# Patient Record
Sex: Female | Born: 1990 | Race: Black or African American | Hispanic: No | Marital: Single | Smoking: Never smoker
Health system: Southern US, Community
[De-identification: ages and names within clinical notes are randomized; demographics above are authoritative.]

## PROBLEM LIST (undated history)

## (undated) DIAGNOSIS — L309 Dermatitis, unspecified: Secondary | ICD-10-CM

## (undated) HISTORY — DX: Dermatitis, unspecified: L30.9

## (undated) HISTORY — PX: NO PAST SURGERIES: SHX2092

---
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
## 2014-08-02 ENCOUNTER — Ambulatory Visit (INDEPENDENT_AMBULATORY_CARE_PROVIDER_SITE_OTHER): Payer: 59 | Admitting: Obstetrics & Gynecology

## 2014-08-02 ENCOUNTER — Encounter: Payer: Self-pay | Admitting: Obstetrics & Gynecology

## 2014-08-02 VITALS — BP 132/87 | HR 71 | Wt 156.0 lb

## 2014-08-02 DIAGNOSIS — Z113 Encounter for screening for infections with a predominantly sexual mode of transmission: Secondary | ICD-10-CM

## 2014-08-02 DIAGNOSIS — A609 Anogenital herpesviral infection, unspecified: Secondary | ICD-10-CM

## 2014-08-02 DIAGNOSIS — Z7251 High risk heterosexual behavior: Secondary | ICD-10-CM

## 2014-08-02 DIAGNOSIS — A6009 Herpesviral infection of other urogenital tract: Secondary | ICD-10-CM

## 2014-08-02 MED ORDER — FAMCICLOVIR 500 MG PO TABS: 500.0000 mg | ORAL_TABLET | Freq: Three times a day (TID) | ORAL | Status: DC

## 2014-08-02 MED ORDER — FAMCICLOVIR 500 MG PO TABS: 500.0000 mg | ORAL_TABLET | Freq: Every day | ORAL | Status: DC

## 2014-08-02 MED ORDER — FAMCICLOVIR 250 MG PO TABS: 250.0000 mg | ORAL_TABLET | Freq: Three times a day (TID) | ORAL | Status: DC

## 2014-08-02 NOTE — Progress Notes (Signed)
Subjective:     Patient ID: Jessica Melendez, female   DOB: [birth year], 23 y.o.   MRN: [id number]  HPI Pt reports that she has painful lesions of the vulva that began 4-5 days prev. She reports that it started with a cold and then a cold sore and then the genital lesions.  She reports that she has had oral intercourse but, not vaginal intercourse for 'months.'  She denies vaginal discharge.   Review of Systems     Objective:   Physical Exam BP 132/87 mmHg  Pulse 71  Wt 156 lb (70.761 kg)  LMP 07/12/2014  Pt in NAD GU: EGBUS: 3 very tender vesicular lesions c/w Herpes at the introitus Could not perform internal exam due to painful lesions.        Assessment:     New onset vesicular lesions c/s genital herpes     Plan:     Cervical cx on urine famvir 250mg  tid x 5day Famvir 500mg  daily for suppresion

## 2014-08-02 NOTE — Patient Instructions (Signed)
Genital Herpes °Genital herpes is a sexually transmitted disease. This means that it is a disease passed by having sex with an infected person. There is no cure for genital herpes. The time between attacks can be months to years. The virus may live in a person but produce no problems (symptoms). This infection can be passed to a baby as it travels down the birth canal (vagina). In a newborn, this can cause central nervous system damage, eye damage, or even death. The virus that causes genital herpes is usually HSV-2 virus. The virus that causes oral herpes is usually HSV-1. The diagnosis (learning what is wrong) is made through culture results. °SYMPTOMS  °Usually symptoms of pain and itching begin a few days to a week after contact. It first appears as small blisters that progress to small painful ulcers which then scab over and heal after several days. It affects the outer genitalia, birth canal, cervix, penis, anal area, buttocks, and thighs. °HOME CARE INSTRUCTIONS  °· Keep ulcerated areas dry and clean. °· Take medications as directed. Antiviral medications can speed up healing. They will not prevent recurrences or cure this infection. These medications can also be taken for suppression if there are frequent recurrences. °· While the infection is active, it is contagious. Avoid all sexual contact during active infections. °· Condoms may help prevent spread of the herpes virus. °· Practice safe sex. °· Wash your hands thoroughly after touching the genital area. °· Avoid touching your eyes after touching your genital area. °· Inform your caregiver if you have had genital herpes and become pregnant. It is your responsibility to insure a safe outcome for your baby in this pregnancy. °· Only take over-the-counter or prescription medicines for pain, discomfort, or fever as directed by your caregiver. °SEEK MEDICAL CARE IF:  °· You have a recurrence of this infection. °· You do not respond to medications and are not  improving. °· You have new sources of pain or discharge which have changed from the original infection. °· You have an oral temperature above 102° F (38.9° C). °· You develop abdominal pain. °· You develop eye pain or signs of eye infection. °Document Released: 08/08/2000 Document Revised: 11/03/2011 Document Reviewed: 08/29/2009 °ExitCare® Patient Information ©2015 [employer]. This information is not intended to replace advice given to you by your health care provider. Make sure you discuss any questions you have with your health care provider. ° °

## 2014-08-03 ENCOUNTER — Other Ambulatory Visit: Payer: Self-pay | Admitting: Obstetrics & Gynecology

## 2014-08-03 DIAGNOSIS — A749 Chlamydial infection, unspecified: Secondary | ICD-10-CM

## 2014-08-03 LAB — GC/CHLAMYDIA PROBE AMP, URINE
Chlamydia, Swab/Urine, PCR: POSITIVE — AB
GC Probe Amp, Urine: NEGATIVE

## 2014-08-03 MED ORDER — AZITHROMYCIN 1 G PO PACK: 1.0000 g | PACK | Freq: Once | ORAL | Status: DC

## 2014-08-04 LAB — HERPES SIMPLEX VIRUS CULTURE: ORGANISM ID, BACTERIA: DETECTED

## 2014-08-04 NOTE — Progress Notes (Signed)
Patient is notified of positive results.

## 2014-08-10 ENCOUNTER — Ambulatory Visit: Payer: 59 | Admitting: Obstetrics & Gynecology

## 2014-08-22 ENCOUNTER — Encounter: Payer: Self-pay | Admitting: Nurse Practitioner

## 2014-08-22 ENCOUNTER — Ambulatory Visit (INDEPENDENT_AMBULATORY_CARE_PROVIDER_SITE_OTHER): Payer: 59 | Admitting: Nurse Practitioner

## 2014-08-22 VITALS — BP 136/76 | HR 67 | Ht 67.0 in | Wt 153.0 lb

## 2014-08-22 DIAGNOSIS — Z30011 Encounter for initial prescription of contraceptive pills: Secondary | ICD-10-CM

## 2014-08-22 DIAGNOSIS — Z309 Encounter for contraceptive management, unspecified: Secondary | ICD-10-CM | POA: Insufficient documentation

## 2014-08-22 DIAGNOSIS — Z124 Encounter for screening for malignant neoplasm of cervix: Secondary | ICD-10-CM

## 2014-08-22 DIAGNOSIS — Z01419 Encounter for gynecological examination (general) (routine) without abnormal findings: Secondary | ICD-10-CM | POA: Insufficient documentation

## 2014-08-22 DIAGNOSIS — Z113 Encounter for screening for infections with a predominantly sexual mode of transmission: Secondary | ICD-10-CM

## 2014-08-22 MED ORDER — LEVONORGEST-ETH ESTRAD 91-DAY 0.15-0.03 MG PO TABS: 1.0000 | ORAL_TABLET | Freq: Every day | ORAL | Status: DC

## 2014-08-22 NOTE — Progress Notes (Signed)
History:  Jessica Melendez a 23 y.o. G0P0000 who presents to [facility] clinic today for well woman exam and contraception management. She has been using condoms and would like to start BCPs. She has taken them in the past without any issues. She denies any migraine with aura, hypertension or blood clotting disorders. 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 Her partner was also treated for Chlamydia. Her LMP was 08/07/14. She denies any other health issues.   The following portions of the patient's history were reviewed and updated as appropriate: allergies, current medications, past family history, past medical history, past social history, past surgical history and problem list.  Review of Systems:  Negative ROS  Objective:  Physical Exam BP 136/76 mmHg  Pulse 67  Ht 5\' 7"  (1.702 m)  Wt 153 lb (69.4 kg)  BMI 23.96 kg/m2  LMP 08/07/2014 GENERAL: Well-developed, well-nourished female in no acute distress.  HEENT: Normocephalic, atraumatic.  NECK: Supple. Normal thyroid.  LUNGS: Normal rate. Clear to auscultation bilaterally.  HEART: Regular rate and rhythm with no adventitious sounds.  BREASTS: Symmetric in size. No masses, skin changes, nipple drainage, or lymphadenopathy. ABDOMEN: Soft, nontender, nondistended. No organomegaly. Normal bowel sounds appreciated in all quadrants.  PELVIC: Normal external female genitalia. Vagina is pink and rugated.  Normal discharge. Normal cervix contour. Pap smear obtained. Uterus is normal in size. No adnexal mass or tenderness.  EXTREMITIES: No cyanosis, clubbing, or edema, 2+ distal pulses.   Labs and Imaging No results found.  Assessment & Plan:  Assessment:  Well Woman Exam Contraception Management  Plans:  Seasonale BCP X 1 year Condoms for one pack and prn partner change Reviewed safe sex practices RTC 1 year or prn    Olegario Messier, NP 08/22/2014 3:21 PM

## 2014-08-22 NOTE — Patient Instructions (Signed)
Contraception Choices Contraception (birth control) is the use of any methods or devices to prevent pregnancy. Below are some methods to help avoid pregnancy. HORMONAL METHODS   Contraceptive implant. This is a thin, plastic tube containing progesterone hormone. It does not contain estrogen hormone. Your health care provider inserts the tube in the inner part of the upper arm. The tube can remain in place for up to 3 years. After 3 years, the implant must be removed. The implant prevents the ovaries from releasing an egg (ovulation), thickens the cervical mucus to prevent sperm from entering the uterus, and thins the lining of the inside of the uterus.  Progesterone-only injections. These injections are given every 3 months by your health care provider to prevent pregnancy. This synthetic progesterone hormone stops the ovaries from releasing eggs. It also thickens cervical mucus and changes the uterine lining. This makes it harder for sperm to survive in the uterus.  Birth control pills. These pills contain estrogen and progesterone hormone. They work by preventing the ovaries from releasing eggs (ovulation). They also cause the cervical mucus to thicken, preventing the sperm from entering the uterus. Birth control pills are prescribed by a health care provider.Birth control pills can also be used to treat heavy periods.  Minipill. This type of birth control pill contains only the progesterone hormone. They are taken every day of each month and must be prescribed by your health care provider.  Birth control patch. The patch contains hormones similar to those in birth control pills. It must be changed once a week and is prescribed by a health care provider.  Vaginal ring. The ring contains hormones similar to those in birth control pills. It is left in the vagina for 3 weeks, removed for 1 week, and then a new one is put back in place. The patient must be comfortable inserting and removing the ring  from the vagina.A health care provider's prescription is necessary.  Emergency contraception. Emergency contraceptives prevent pregnancy after unprotected sexual intercourse. This pill can be taken right after sex or up to 5 days after unprotected sex. It is most effective the sooner you take the pills after having sexual intercourse. Most emergency contraceptive pills are available without a prescription. Check with your pharmacist. Do not use emergency contraception as your only form of birth control. BARRIER METHODS   Female condom. This is a thin sheath (latex or rubber) that is worn over the penis during sexual intercourse. It can be used with spermicide to increase effectiveness.  Female condom. This is a soft, loose-fitting sheath that is put into the vagina before sexual intercourse.  Diaphragm. This is a soft, latex, dome-shaped barrier that must be fitted by a health care provider. It is inserted into the vagina, along with a spermicidal jelly. It is inserted before intercourse. The diaphragm should be left in the vagina for 6 to 8 hours after intercourse.  Cervical cap. This is a round, soft, latex or plastic cup that fits over the cervix and must be fitted by a health care provider. The cap can be left in place for up to 48 hours after intercourse.  Sponge. This is a soft, circular piece of polyurethane foam. The sponge has spermicide in it. It is inserted into the vagina after wetting it and before sexual intercourse.  Spermicides. These are chemicals that kill or block sperm from entering the cervix and uterus. They come in the form of creams, jellies, suppositories, foam, or tablets. They do not require a   prescription. They are inserted into the vagina with an applicator before having sexual intercourse. The process must be repeated every time you have sexual intercourse. INTRAUTERINE CONTRACEPTION  Intrauterine device (IUD). This is a T-shaped device that is put in a woman's uterus  during a menstrual period to prevent pregnancy. There are 2 types:  Copper IUD. This type of IUD is wrapped in copper wire and is placed inside the uterus. Copper makes the uterus and fallopian tubes produce a fluid that kills sperm. It can stay in place for 10 years.  Hormone IUD. This type of IUD contains the hormone progestin (synthetic progesterone). The hormone thickens the cervical mucus and prevents sperm from entering the uterus, and it also thins the uterine lining to prevent implantation of a fertilized egg. The hormone can weaken or kill the sperm that get into the uterus. It can stay in place for 3-5 years, depending on which type of IUD is used. PERMANENT METHODS OF CONTRACEPTION  Female tubal ligation. This is when the woman's fallopian tubes are surgically sealed, tied, or blocked to prevent the egg from traveling to the uterus.  Hysteroscopic sterilization. This involves placing a small coil or insert into each fallopian tube. Your doctor uses a technique called hysteroscopy to do the procedure. The device causes scar tissue to form. This results in permanent blockage of the fallopian tubes, so the sperm cannot fertilize the egg. It takes about 3 months after the procedure for the tubes to become blocked. You must use another form of birth control for these 3 months.  Female sterilization. This is when the female has the tubes that carry sperm tied off (vasectomy).This blocks sperm from entering the vagina during sexual intercourse. After the procedure, the man can still ejaculate fluid (semen). NATURAL PLANNING METHODS  Natural family planning. This is not having sexual intercourse or using a barrier method (condom, diaphragm, cervical cap) on days the woman could become pregnant.  Calendar method. This is keeping track of the length of each menstrual cycle and identifying when you are fertile.  Ovulation method. This is avoiding sexual intercourse during ovulation.  Symptothermal  method. This is avoiding sexual intercourse during ovulation, using a thermometer and ovulation symptoms.  Post-ovulation method. This is timing sexual intercourse after you have ovulated. Regardless of which type or method of contraception you choose, it is important that you use condoms to protect against the transmission of sexually transmitted infections (STIs). Talk with your health care provider about which form of contraception is most appropriate for you. Document Released: 08/11/2005 Document Revised: 08/16/2013 Document Reviewed: 02/03/2013 ExitCare Patient Information 2015 [employer]. This information is not intended to replace advice given to you by your health care provider. Make sure you discuss any questions you have with your health care provider.  

## 2014-08-22 NOTE — Progress Notes (Signed)
Here today for gyn physical and to discuss [facility], currently using condoms.  No unprotected sex within last two weeks.

## 2014-08-28 LAB — CYTOLOGY - PAP

## 2014-10-23 ENCOUNTER — Telehealth (HOSPITAL_COMMUNITY): Payer: Self-pay

## 2014-10-23 NOTE — Telephone Encounter (Signed)
STD treatment requested by [facility] w/the [facility].Marland Kitchen

## 2015-03-20 ENCOUNTER — Encounter: Payer: Self-pay | Admitting: Obstetrics & Gynecology

## 2015-03-20 ENCOUNTER — Ambulatory Visit (INDEPENDENT_AMBULATORY_CARE_PROVIDER_SITE_OTHER): Payer: 59 | Admitting: Obstetrics & Gynecology

## 2015-03-20 ENCOUNTER — Other Ambulatory Visit (HOSPITAL_COMMUNITY)
Admission: RE | Admit: 2015-03-20 | Discharge: 2015-03-20 | Disposition: A | Discharge status: Home / Self Care | Payer: 59 | Source: Ambulatory Visit | Attending: Obstetrics & Gynecology | Admitting: Obstetrics & Gynecology

## 2015-03-20 VITALS — BP 116/78 | HR 82 | Resp 16 | Wt 156.0 lb

## 2015-03-20 DIAGNOSIS — Z113 Encounter for screening for infections with a predominantly sexual mode of transmission: Secondary | ICD-10-CM | POA: Insufficient documentation

## 2015-03-20 DIAGNOSIS — Z01419 Encounter for gynecological examination (general) (routine) without abnormal findings: Secondary | ICD-10-CM

## 2015-03-20 DIAGNOSIS — Z30011 Encounter for initial prescription of contraceptive pills: Secondary | ICD-10-CM | POA: Diagnosis not present

## 2015-03-20 DIAGNOSIS — Z0000 Encounter for general adult medical examination without abnormal findings: Secondary | ICD-10-CM

## 2015-03-20 MED ORDER — LEVONORGEST-ETH ESTRAD 91-DAY 0.15-0.03 &0.01 MG PO TABS: 1.0000 | ORAL_TABLET | Freq: Every day | ORAL | Status: DC

## 2015-03-20 NOTE — Progress Notes (Signed)
Subjective:    Jessica Melendez is a 24 y.o. S AA G96  female who presents for an annual exam. The patient has no complaints today. She would like to start birth control. She previously used OCPs, using condoms since then. The patient is sexually active. GYN screening history: last pap: was normal. The patient wears seatbelts: yes. The patient participates in regular exercise: yes. (played soccer in college)  Has the patient ever been transfused or tattooed?: no. The patient reports that there is not domestic violence in her life.   Menstrual History: OB History    Gravida Para Term Preterm AB TAB SAB Ectopic Multiple Living   0 0 0 0 0 0 0 0 0 0       Menarche age: 5  Patient's last menstrual period was 03/14/2015.    The following portions of the patient's history were reviewed and updated as appropriate: allergies, current medications, past family history, past medical history, past social history, past surgical history and problem list.  Review of Systems A comprehensive review of systems was negative. monogamous for about a year. Graduated and plans to go to nursing school (Mom is a Marine scientist at [employer]).   Objective:    BP 116/78 mmHg  Pulse 82  Wt 156 lb (70.761 kg)  LMP 03/14/2015  General Appearance:    Alert, cooperative, no distress, appears stated age  Head:    Normocephalic, without obvious abnormality, atraumatic  Eyes:    PERRL, conjunctiva/corneas clear, EOM's intact, fundi    benign, both eyes  Ears:    Normal TM's and external ear canals, both ears  Nose:   Nares normal, septum midline, mucosa normal, no drainage    or sinus tenderness  Throat:   Lips, mucosa, and tongue normal; teeth and gums normal  Neck:   Supple, symmetrical, trachea midline, no adenopathy;    thyroid:  no enlargement/tenderness/nodules; no carotid   bruit or JVD  Back:     Symmetric, no curvature, ROM normal, no CVA tenderness  Lungs:     Clear to auscultation bilaterally, respirations unlabored   Chest Wall:    No tenderness or deformity   Heart:    Regular rate and rhythm, S1 and S2 normal, no murmur, rub   or gallop  Breast Exam:    No tenderness, masses, or nipple abnormality  Abdomen:     Soft, non-tender, bowel sounds active all four quadrants,    no masses, no organomegaly  Genitalia:    Normal female without lesion, discharge or tenderness, NSSR, NT, minimal mobility, normal adnexal exam     Extremities:   Extremities normal, atraumatic, no cyanosis or edema  Pulses:   2+ and symmetric all extremities  Skin:   Skin color, texture, turgor normal, no rashes or lesions  Lymph nodes:   Cervical, supraclavicular, and axillary nodes normal  Neurologic:   CNII-XII intact, normal strength, sensation and reflexes    throughout  .    Assessment:    Healthy female exam.   Contraception   Plan:     Breast self exam technique reviewed and patient encouraged to perform self-exam monthly. Chlamydia specimen. GC specimen. Thin prep Pap smear.   camrese extended cycle OCPs She will find out if she has had Gardasil and rtc for it prn

## 2015-03-21 LAB — CYTOLOGY - PAP

## 2015-03-29 ENCOUNTER — Encounter: Payer: Self-pay | Admitting: Primary Care

## 2015-03-29 ENCOUNTER — Ambulatory Visit (INDEPENDENT_AMBULATORY_CARE_PROVIDER_SITE_OTHER): Payer: 59 | Admitting: Primary Care

## 2015-03-29 VITALS — BP 106/68 | HR 70 | Temp 98.2°F | Ht 67.0 in | Wt 158.4 lb

## 2015-03-29 DIAGNOSIS — Z7189 Other specified counseling: Secondary | ICD-10-CM

## 2015-03-29 DIAGNOSIS — Z7689 Persons encountering health services in other specified circumstances: Secondary | ICD-10-CM

## 2015-03-29 NOTE — Progress Notes (Signed)
Pre visit review using our clinic review tool, if applicable. No additional management support is needed unless otherwise documented below in the visit note. 

## 2015-03-29 NOTE — Progress Notes (Signed)
   Subjective:    Patient ID: Jessica Melendez, female    DOB: 27-Jul-1991, 24 y.o.   MRN: [id number]  HPI  Jessica Melendez is a 24 year old female who presents today to establish care. She has no complaints today. She follows with OB/GYN for annual exams and birth control maintenance. Her pap was completed this year. She's not had a complete physical in several years.   Review of Systems  Constitutional: Negative for unexpected weight change.  HENT: Negative for rhinorrhea.   Respiratory: Negative for cough and shortness of breath.   Cardiovascular: Negative for chest pain.  Gastrointestinal: Negative for diarrhea and constipation.  Genitourinary: Negative for difficulty urinating.       Periods regular. Follows with GYN  Musculoskeletal: Negative for myalgias and arthralgias.  Skin: Negative for rash.  Neurological: Negative for dizziness, numbness and headaches.  Psychiatric/Behavioral:       Denies concerns for anxiety or depression       No past medical history on file.  History   Social History  . Marital Status: Single    Spouse Name: N/A  . Number of Children: N/A  . Years of Education: N/A   Occupational History  . Not on file.   Social History Main Topics  . Smoking status: Never Smoker   . Smokeless tobacco: Never Used  . Alcohol Use: 0.0 oz/week    0 Standard drinks or equivalent per week     Comment: socially  . Drug Use: No  . Sexual Activity:    Partners: Male    Birth Control/ Protection: Condom   Other Topics Concern  . Not on file   Social History Narrative   Single.   Highest level of education in Biology from [place].   She aspires to be a Marine scientist and then possibly to be a NP.   Works as a Programme researcher, broadcasting/film/video at [employer].   Enjoys playing soccer.     No past surgical history on file.  Family History  Problem Relation Age of Onset  . Diabetes Paternal Grandfather     No Known Allergies  No current outpatient prescriptions on file prior to visit.     No current facility-administered medications on file prior to visit.    BP 106/68 mmHg  Pulse 70  Temp(Src) 98.2 F (36.8 C) (Oral)  Ht 5\' 7"  (1.702 m)  Wt 158 lb 6.4 oz (71.85 kg)  BMI 24.80 kg/m2  SpO2 99%  LMP 03/14/2015    Objective:   Physical Exam  Constitutional: She is oriented to person, place, and time. She appears well-nourished.  Cardiovascular: Normal rate and regular rhythm.   Pulmonary/Chest: Effort normal and breath sounds normal.  Neurological: She is alert and oriented to person, place, and time.  Skin: Skin is warm and dry.  Psychiatric: She has a normal mood and affect.          Assessment & Plan:  No complaints today.  Due for physical, will have scheduled in the next 4 months. Managed by GYN for birth control pills and annual exams.

## 2015-03-29 NOTE — Patient Instructions (Addendum)
Please schedule a physical with me in the next 4 months.   It was a pleasure to meet you today! Please don't hesitate to call me with any questions. Welcome to [facility]!

## 2015-06-04 ENCOUNTER — Telehealth: Payer: Self-pay | Admitting: Primary Care

## 2015-06-04 NOTE — Telephone Encounter (Signed)
Place form in Kate's inbox. 

## 2015-06-04 NOTE — Telephone Encounter (Signed)
Completed and is ready for pick up at her convenience.

## 2015-06-04 NOTE — Telephone Encounter (Signed)
Pt dropped off form for work. Please call [phone] when ready to pick up. Placing  On Chan;s desk  thanks

## 2015-06-06 ENCOUNTER — Encounter: Payer: Self-pay | Admitting: Primary Care

## 2015-06-27 ENCOUNTER — Encounter: Payer: Self-pay | Admitting: Primary Care

## 2015-06-27 ENCOUNTER — Ambulatory Visit (INDEPENDENT_AMBULATORY_CARE_PROVIDER_SITE_OTHER): Payer: 59 | Admitting: Primary Care

## 2015-06-27 VITALS — BP 118/76 | HR 74 | Temp 98.6°F | Ht 67.0 in | Wt 158.8 lb

## 2015-06-27 DIAGNOSIS — R05 Cough: Secondary | ICD-10-CM | POA: Diagnosis not present

## 2015-06-27 DIAGNOSIS — R059 Cough, unspecified: Secondary | ICD-10-CM

## 2015-06-27 MED ORDER — LEVONORGEST-ETH ESTRAD 91-DAY 0.15-0.03 &0.01 MG PO TABS: 1.0000 | ORAL_TABLET | Freq: Every day | ORAL | Status: DC

## 2015-06-27 MED ORDER — BENZONATATE 200 MG PO CAPS: 200.0000 mg | ORAL_CAPSULE | Freq: Three times a day (TID) | ORAL | Status: DC | PRN

## 2015-06-27 MED ORDER — HYDROCODONE-HOMATROPINE 5-1.5 MG/5ML PO SYRP: 5.0000 mL | ORAL_SOLUTION | Freq: Every evening | ORAL | Status: DC | PRN

## 2015-06-27 NOTE — Addendum Note (Signed)
Addended by: Jacqualin Combes on: 06/27/2015 03:39 PM   Modules accepted: Orders

## 2015-06-27 NOTE — Progress Notes (Signed)
Subjective:    Patient ID: Jessica Melendez, female    DOB: December 07, 1990, 24 y.o.   MRN: [id number]  HPI  Jessica Melendez is a 24 year old female who presents today with a chief complaint of cough. She also reports symptoms of sore throat, nasal congestion, and fever of 101 on Saturday. She's not noticed a fever since. Her symptoms began Saturday night. She was starting to feel better on Monday morning and then started to feel worse on Tuesday. She's tried taking Nyquil and Dayquil without improvement in cough, and ibuprofen with some improvement in sore throat.  Her cough has become worse and her sore throat is very bothersome.   Review of Systems  Constitutional: Positive for fever and chills.  HENT: Positive for congestion, sinus pressure and sore throat. Negative for ear pain.   Respiratory: Positive for cough. Negative for shortness of breath.   Cardiovascular: Negative for chest pain.  Gastrointestinal: Negative for nausea.  Musculoskeletal: Negative for myalgias.       No past medical history on file.  Social History   Social History  . Marital Status: Single    Spouse Name: N/A  . Number of Children: N/A  . Years of Education: N/A   Occupational History  . Not on file.   Social History Main Topics  . Smoking status: Never Smoker   . Smokeless tobacco: Never Used  . Alcohol Use: 0.0 oz/week    0 Standard drinks or equivalent per week     Comment: socially  . Drug Use: No  . Sexual Activity:    Partners: Male    Birth Control/ Protection: Condom   Other Topics Concern  . Not on file   Social History Narrative   ** Merged History Encounter **       Single. Highest level of education in Biology from [employer]. She aspires to be a Marine scientist and then possibly to be a NP. 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     No past surgical history on file.  Family History  Problem Relation Age of Onset  . Diabetes Paternal Grandfather     No Known  Allergies  Current Outpatient Prescriptions on File Prior to Visit  Medication Sig Dispense Refill  . Levonorgestrel-Ethinyl Estradiol (AMETHIA,CAMRESE) 0.15-0.03 &0.01 MG tablet Take 1 tablet by mouth daily. 1 Package 4   No current facility-administered medications on file prior to visit.    BP 118/76 mmHg  Pulse 74  Temp(Src) 98.6 F (37 C) (Oral)  Ht 5\' 7"  (1.702 m)  Wt 158 lb 12.8 oz (72.031 kg)  BMI 24.87 kg/m2  SpO2 99%  LMP 06/24/2015    Objective:   Physical Exam  Constitutional: She appears well-nourished.  HENT:  Right Ear: Tympanic membrane and ear canal normal.  Left Ear: Tympanic membrane and ear canal normal.  Nose: Right sinus exhibits no maxillary sinus tenderness and no frontal sinus tenderness. Left sinus exhibits no maxillary sinus tenderness and no frontal sinus tenderness.  Mouth/Throat: Oropharynx is clear and moist.  Eyes: Conjunctivae are normal. Pupils are equal, round, and reactive to light.  Neck: Neck supple.  Cardiovascular: Normal rate and regular rhythm.   Pulmonary/Chest: Effort normal and breath sounds normal. She has no wheezes. She has no rales.  Lymphadenopathy:    She has no cervical adenopathy.  Skin: Skin is warm and dry.          Assessment & Plan:  Viral URI:  Cough, nasal  congestion, fevers, sinus pressure x 4 days. Overall feeling worse, especially cough. No improvement with OTC cough meds. Some improvement with ibuprofen. Exam unremarkable which is reassuring.  Discussed treatment with supportive measures. RX for tessalon pearls for daytime cough, Hycodan HS PRN. Flonase, mucinex, ibuprofen. She is to call Monday if no improvement.

## 2015-06-27 NOTE — Progress Notes (Signed)
Pre visit review using our clinic review tool, if applicable. No additional management support is needed unless otherwise documented below in the visit note. 

## 2015-06-27 NOTE — Patient Instructions (Signed)
Your symptoms are related to a viral illness which is not treated with antibiotics.  Sore throat: Take ibuprofen 600 mg three times daily as needed. You should also do warm, salt gargles three times daily.   Nasal congestion: Start Flonase nasal spray. Instill 2 sprays in each nostril once daily.  Cough: You may take the Benzonatate capsules. Take 1 capsule by mouth three times daily as needed for daytime cough. You may take the Hycodan cough syrup at bedtime for cough and rest.  Please call me if no improvement Monday next week.  It was a pleasure to see you today!  Upper Respiratory Infection, Adult Most upper respiratory infections (URIs) are a viral infection of the air passages leading to the lungs. A URI affects the nose, throat, and upper air passages. The most common type of URI is nasopharyngitis and is typically referred to as "the common cold." URIs run their course and usually go away on their own. Most of the time, a URI does not require medical attention, but sometimes a bacterial infection in the upper airways can follow a viral infection. This is called a secondary infection. Sinus and middle ear infections are common types of secondary upper respiratory infections. Bacterial pneumonia can also complicate a URI. A URI can worsen asthma and chronic obstructive pulmonary disease (COPD). Sometimes, these complications can require emergency medical care and may be life threatening.  CAUSES Almost all URIs are caused by viruses. A virus is a type of germ and can spread from one person to another.  RISKS FACTORS You may be at risk for a URI if:   You smoke.   You have chronic heart or lung disease.  You have a weakened defense (immune) system.   You are very young or very old.   You have nasal allergies or asthma.  You work in crowded or poorly ventilated areas.  You work in health care facilities or schools. SIGNS AND SYMPTOMS  Symptoms typically develop 2-3 days  after you come in contact with a cold virus. Most viral URIs last 7-10 days. However, viral URIs from the influenza virus (flu virus) can last 14-18 days and are typically more severe. Symptoms may include:   Runny or stuffy (congested) nose.   Sneezing.   Cough.   Sore throat.   Headache.   Fatigue.   Fever.   Loss of appetite.   Pain in your forehead, behind your eyes, and over your cheekbones (sinus pain).  Muscle aches.  DIAGNOSIS  Your health care Taylon Coole may diagnose a URI by:  Physical exam.  Tests to check that your symptoms are not due to another condition such as:  Strep throat.  Sinusitis.  Pneumonia.  Asthma. TREATMENT  A URI goes away on its own with time. It cannot be cured with medicines, but medicines may be prescribed or recommended to relieve symptoms. Medicines may help:  Reduce your fever.  Reduce your cough.  Relieve nasal congestion. HOME CARE INSTRUCTIONS   Take medicines only as directed by your health care Daanish Copes.   Gargle warm saltwater or take cough drops to comfort your throat as directed by your health care Danasha Melman.  Use a warm mist humidifier or inhale steam from a shower to increase air moisture. This may make it easier to breathe.  Drink enough fluid to keep your urine clear or pale yellow.   Eat soups and other clear broths and maintain good nutrition.   Rest as needed.   Return to work  when your temperature has returned to normal or as your health care Chasin Findling advises. You may need to stay home longer to avoid infecting others. You can also use a face mask and careful hand washing to prevent spread of the virus.  Increase the usage of your inhaler if you have asthma.   Do not use any tobacco products, including cigarettes, chewing tobacco, or electronic cigarettes. If you need help quitting, ask your health care Tranise Forrest. PREVENTION  The best way to protect yourself from getting a cold is to practice  good hygiene.   Avoid oral or hand contact with people with cold symptoms.   Wash your hands often if contact occurs.  There is no clear evidence that vitamin C, vitamin E, echinacea, or exercise reduces the chance of developing a cold. However, it is always recommended to get plenty of rest, exercise, and practice good nutrition.  SEEK MEDICAL CARE IF:   You are getting worse rather than better.   Your symptoms are not controlled by medicine.   You have chills.  You have worsening shortness of breath.  You have brown or red mucus.  You have yellow or brown nasal discharge.  You have pain in your face, especially when you bend forward.  You have a fever.  You have swollen neck glands.  You have pain while swallowing.  You have white areas in the back of your throat. SEEK IMMEDIATE MEDICAL CARE IF:   You have severe or persistent:  Headache.  Ear pain.  Sinus pain.  Chest pain.  You have chronic lung disease and any of the following:  Wheezing.  Prolonged cough.  Coughing up blood.  A change in your usual mucus.  You have a stiff neck.  You have changes in your:  Vision.  Hearing.  Thinking.  Mood. MAKE SURE YOU:   Understand these instructions.  Will watch your condition.  Will get help right away if you are not doing well or get worse.   This information is not intended to replace advice given to you by your health care Zamarion Longest. Make sure you discuss any questions you have with your health care Ellenie Salome.   Document Released: 02/04/2001 Document Revised: 12/26/2014 Document Reviewed: 11/16/2013 Elsevier Interactive Patient Education [employer].

## 2015-07-31 ENCOUNTER — Encounter: Payer: 59 | Admitting: Primary Care

## 2015-07-31 DIAGNOSIS — Z0289 Encounter for other administrative examinations: Secondary | ICD-10-CM

## 2016-01-23 ENCOUNTER — Emergency Department: Payer: No Typology Code available for payment source

## 2016-01-23 ENCOUNTER — Encounter: Payer: Self-pay | Admitting: Medical Oncology

## 2016-01-23 ENCOUNTER — Emergency Department
Admission: EM | Admit: 2016-01-23 | Discharge: 2016-01-23 | Disposition: A | Discharge status: Home / Self Care | Payer: No Typology Code available for payment source | Attending: Emergency Medicine | Admitting: Emergency Medicine

## 2016-01-23 DIAGNOSIS — Y999 Unspecified external cause status: Secondary | ICD-10-CM | POA: Insufficient documentation

## 2016-01-23 DIAGNOSIS — Y92410 Unspecified street and highway as the place of occurrence of the external cause: Secondary | ICD-10-CM | POA: Diagnosis not present

## 2016-01-23 DIAGNOSIS — S4992XA Unspecified injury of left shoulder and upper arm, initial encounter: Secondary | ICD-10-CM | POA: Diagnosis not present

## 2016-01-23 DIAGNOSIS — S60511A Abrasion of right hand, initial encounter: Secondary | ICD-10-CM | POA: Diagnosis not present

## 2016-01-23 DIAGNOSIS — S8002XA Contusion of left knee, initial encounter: Secondary | ICD-10-CM | POA: Diagnosis not present

## 2016-01-23 DIAGNOSIS — S60512A Abrasion of left hand, initial encounter: Secondary | ICD-10-CM | POA: Diagnosis not present

## 2016-01-23 DIAGNOSIS — S46912A Strain of unspecified muscle, fascia and tendon at shoulder and upper arm level, left arm, initial encounter: Secondary | ICD-10-CM | POA: Diagnosis not present

## 2016-01-23 DIAGNOSIS — Z79899 Other long term (current) drug therapy: Secondary | ICD-10-CM | POA: Diagnosis not present

## 2016-01-23 DIAGNOSIS — S8992XA Unspecified injury of left lower leg, initial encounter: Secondary | ICD-10-CM | POA: Diagnosis not present

## 2016-01-23 DIAGNOSIS — Y9389 Activity, other specified: Secondary | ICD-10-CM | POA: Insufficient documentation

## 2016-01-23 DIAGNOSIS — M25512 Pain in left shoulder: Secondary | ICD-10-CM | POA: Diagnosis not present

## 2016-01-23 DIAGNOSIS — S46911A Strain of unspecified muscle, fascia and tendon at shoulder and upper arm level, right arm, initial encounter: Secondary | ICD-10-CM | POA: Diagnosis not present

## 2016-01-23 DIAGNOSIS — M25562 Pain in left knee: Secondary | ICD-10-CM | POA: Diagnosis not present

## 2016-01-23 DIAGNOSIS — T07XXXA Unspecified multiple injuries, initial encounter: Secondary | ICD-10-CM

## 2016-01-23 MED ORDER — IBUPROFEN 600 MG PO TABS: 600.0000 mg | ORAL_TABLET | Freq: Three times a day (TID) | ORAL | Status: DC | PRN

## 2016-01-23 NOTE — Discharge Instructions (Signed)
Begin taking ibuprofen as needed for pain and inflammation. May also use ice to areas as needed for discomfort. Apply a thin layer of Neosporin to abrasions and watch for signs of infection. Follow up with  your doctor if any continued problems
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
## 2016-01-25 ENCOUNTER — Ambulatory Visit: Payer: 59 | Admitting: Family Medicine

## 2016-01-25 DIAGNOSIS — Z0289 Encounter for other administrative examinations: Secondary | ICD-10-CM

## 2016-01-31 ENCOUNTER — Ambulatory Visit (INDEPENDENT_AMBULATORY_CARE_PROVIDER_SITE_OTHER): Payer: 59 | Admitting: Primary Care

## 2016-01-31 VITALS — BP 112/66 | HR 74 | Temp 98.0°F | Ht 67.0 in | Wt 161.8 lb

## 2016-01-31 DIAGNOSIS — M62838 Other muscle spasm: Secondary | ICD-10-CM

## 2016-01-31 DIAGNOSIS — N898 Other specified noninflammatory disorders of vagina: Secondary | ICD-10-CM

## 2016-01-31 MED ORDER — CYCLOBENZAPRINE HCL 5 MG PO TABS: 5.0000 mg | ORAL_TABLET | Freq: Three times a day (TID) | ORAL | Status: DC | PRN

## 2016-01-31 NOTE — Progress Notes (Signed)
Pre visit review using our clinic review tool, if applicable. No additional management support is needed unless otherwise documented below in the visit note. 

## 2016-01-31 NOTE — Patient Instructions (Addendum)
Start Cyclobenzaprine tablets for muscle stiffness/spasms to left shoulder. Take 1 tablet by mouth 1-3 times daily as needed for muscle spasms. Caution as this may cause drowsiness.  You will be sore for the next 1-2 weeks, but you should gradually notice an improvement between now and then.  Please notify me if you've not experienced any improvement in discomfort in 1 week.  I will notify you once I receive your vaginal specimen.  It was a pleasure to see you today!
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
## 2016-02-01 LAB — WET PREP BY MOLECULAR PROBE
Candida species: NEGATIVE
GARDNERELLA VAGINALIS: NEGATIVE
TRICHOMONAS VAG: NEGATIVE

## 2016-02-28 ENCOUNTER — Ambulatory Visit (INDEPENDENT_AMBULATORY_CARE_PROVIDER_SITE_OTHER): Payer: 59 | Admitting: Primary Care

## 2016-02-28 ENCOUNTER — Encounter: Payer: Self-pay | Admitting: Primary Care

## 2016-02-28 VITALS — BP 108/72 | HR 66 | Temp 98.4°F | Ht 67.0 in | Wt 160.8 lb

## 2016-02-28 DIAGNOSIS — M25512 Pain in left shoulder: Secondary | ICD-10-CM

## 2016-02-28 DIAGNOSIS — M62830 Muscle spasm of back: Secondary | ICD-10-CM

## 2016-02-28 MED ORDER — METHOCARBAMOL 500 MG PO TABS: 500.0000 mg | ORAL_TABLET | Freq: Three times a day (TID) | ORAL | Status: DC | PRN

## 2016-02-28 NOTE — Progress Notes (Signed)
Pre visit review using our clinic review tool, if applicable. No additional management support is needed unless otherwise documented below in the visit note. 

## 2016-02-28 NOTE — Patient Instructions (Addendum)
Stop Flexeril medication for muscle spasms. Start Robaxin (methocarbamol) tablets as needed for muscle spasms. Take 1 tablet by mouth three times daily as needed. This may cause drowsiness.   You will be contacted regarding your referral to Physical Therapy.  Please let us know if you have not heard back within one week.   It was a pleasure to see you today!  Shoulder Pain The shoulder is the joint that connects your arms to your body. The bones that form the shoulder joint include the upper arm bone (humerus), the shoulder blade (scapula), and the collarbone (clavicle). The top of the humerus is shaped like a ball and fits into a rather flat socket on the scapula (glenoid cavity). A combination of muscles and strong, fibrous tissues that connect muscles to bones (tendons) support your shoulder joint and hold the ball in the socket. Small, fluid-filled sacs (bursae) are located in different areas of the joint. They act as cushions between the bones and the overlying soft tissues and help reduce friction between the gliding tendons and the bone as you move your arm. Your shoulder joint allows a wide range of motion in your arm. This range of motion allows you to do things like scratch your back or throw a ball. However, this range of motion also makes your shoulder more prone to pain from overuse and injury. Causes of shoulder pain can originate from both injury and overuse and usually can be grouped in the following four categories:  Redness, swelling, and pain (inflammation) of the tendon (tendinitis) or the bursae (bursitis).  Instability, such as a dislocation of the joint.  Inflammation of the joint (arthritis).  Broken bone (fracture). HOME CARE INSTRUCTIONS   Apply ice to the sore area.  Put ice in a plastic bag.  Place a towel between your skin and the bag.  Leave the ice on for 15-20 minutes, 3-4 times per day for the first 2 days, or as directed by your health care provider.  Stop  using cold packs if they do not help with the pain.  If you have a shoulder sling or immobilizer, wear it as long as your caregiver instructs. Only remove it to shower or bathe. Move your arm as little as possible, but keep your hand moving to prevent swelling.  Squeeze a soft ball or foam pad as much as possible to help prevent swelling.  Only take over-the-counter or prescription medicines for pain, discomfort, or fever as directed by your caregiver. SEEK MEDICAL CARE IF:   Your shoulder pain increases, or new pain develops in your arm, hand, or fingers.  Your hand or fingers become cold and numb.  Your pain is not relieved with medicines. SEEK IMMEDIATE MEDICAL CARE IF:   Your arm, hand, or fingers are numb or tingling.  Your arm, hand, or fingers are significantly swollen or turn white or blue. MAKE SURE YOU:   Understand these instructions.  Will watch your condition.  Will get help right away if you are not doing well or get worse.   This information is not intended to replace advice given to you by your health care provider. Make sure you discuss any questions you have with your health care provider.   Document Released: 05/21/2005 Document Revised: 09/01/2014 Document Reviewed: 12/04/2014 Elsevier Interactive Patient Education [employer].
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
## 2016-03-17 ENCOUNTER — Ambulatory Visit: Payer: 59 | Attending: Primary Care | Admitting: Physical Therapy

## 2016-03-17 DIAGNOSIS — M25512 Pain in left shoulder: Secondary | ICD-10-CM | POA: Diagnosis not present

## 2016-03-17 DIAGNOSIS — M546 Pain in thoracic spine: Secondary | ICD-10-CM | POA: Diagnosis not present

## 2016-03-17 DIAGNOSIS — M542 Cervicalgia: Secondary | ICD-10-CM | POA: Insufficient documentation
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
## 2016-03-20 ENCOUNTER — Ambulatory Visit: Payer: 59 | Admitting: Physical Therapy

## 2016-03-20 DIAGNOSIS — M25512 Pain in left shoulder: Secondary | ICD-10-CM

## 2016-03-20 DIAGNOSIS — M542 Cervicalgia: Secondary | ICD-10-CM

## 2016-03-20 DIAGNOSIS — M546 Pain in thoracic spine: Secondary | ICD-10-CM | POA: Diagnosis not present

## 2016-03-20 NOTE — Therapy (Signed)
[facility] [address] [place], [address] Phone: [phone]   Fax:  [phone]  Physical Therapy Treatment  Patient Details  Name: Jessica Melendez MRN: [id number] Date of Birth: [birth year] No Data Recorded  Encounter Date: 03/20/2016      PT End of Session - 03/20/16 1115    Visit Number 2   Number of Visits 9   Date for PT Re-Evaluation 04/14/16   PT Start Time [id number]   PT Stop Time 1111   PT Time Calculation (min) 26 min   Activity Tolerance Patient tolerated treatment well   Behavior During Therapy [facility] for tasks assessed/performed      No past medical history on file.  No past surgical history on file.  There were no vitals filed for this visit.      Subjective Assessment - 03/20/16 1046    Subjective Patient reports she was sore after evaluation but has felt minimal if any shoulder pain since that time. She reports she feels stiffness around her L scapula during retractions, however pain is now more in thoracic spine/cervical spine.    Limitations Lifting   Diagnostic tests X-ray did not show any fractures.    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    Currently in Pain? No/denies   Pain Onset More than a month ago   Aggravating Factors  Retractions (stiffness around L scapula)         Manual Therapy CPAs grade I-II mobilizations at tender points most notably T4/5, T2, C6, C4/5 performed 3 bouts of 30" with notable relief in stiffness/pain in thoracic spine afterwards. Patient reported residual stiffness, PT performed 4 grade IV mobilizations to mid-thoracic spine with near complete resolution of reported stiffness.   Low rows with red t-band 2 sets x 12 repetitions on LUE only. Patient reported fatigue, no overt pain.                           PT Education - 03/20/16 1114    Education provided Yes   Education  Details Perform low rows as well as HEP from previous session and she should progress to have full symptom resolution in 1-2 weeks.    Person(s) Educated Patient   Methods Explanation;Demonstration;Handout   Comprehension Returned demonstration;Verbalized understanding             PT Long Term Goals - 03/18/16 1546      PT LONG TERM GOAL #1   Title Patient will report a QuickDash score of less than 15% to demonstrate tolerance for ADLs.    Baseline Did not fill out    Time 4   Period Weeks   Status New     PT LONG TERM GOAL #2   Title Patient will demonstrate ability to flex LUE to at least 170 degrees flexion to demonstrate improved AROM for ADLs.    Baseline 155   Time 4   Period Weeks   Status New     PT LONG TERM GOAL #3   Title Patient will report worst pain VAS score of less than 3/10 to demonstrate improved tolerance for ADLs.    Baseline 8/10   Time 4   Period Weeks   Status New     PT LONG TERM GOAL #4   Title Patient will be able to lift at least 20# with no increase in  symptoms to return to work related functions.    Time 4   Period Weeks   Status New               Plan - 03/20/16 1115    Clinical Impression Statement Patient has regained full active ROM of LUE with no reports of shoulder pain currently. She reports stiffness/tightness in her thoracic spine, with mild pain noted in cervical spine as well, relieved with P-A mobilizations. She was provided with red t-band and low rows to address any LT deficits.    Rehab Potential Excellent   Clinical Impairments Affecting Rehab Potential [place] response to therapy, young/healthy, acute onset of pain.    PT Frequency 2x / week   PT Duration 4 weeks   PT Treatment/Interventions Aquatic Therapy;Biofeedback;Cryotherapy;Electrical Stimulation;Manual techniques;Dry needling;Taping;Patient/family education;Therapeutic exercise;Therapeutic activities;Traction   PT Next Visit Plan Joint mobilizations/thoracic  manipulation, soft tissue mobilization as needed.    PT Home Exercise Plan Seated thoracic extension   Consulted and Agree with Plan of Care Patient      Patient will benefit from skilled therapeutic intervention in order to improve the following deficits and impairments:  Impaired UE functional use, Pain, Decreased strength  Visit Diagnosis: Cervicalgia  Pain in thoracic spine  Pain in left shoulder     Problem List Patient Active Problem List   Diagnosis Date Noted  . Contraception management 08/22/2014  . Well woman exam with routine gynecological exam 08/22/2014   Kerman Passey, PT, DPT    03/20/2016, 2:18 PM  [facility] [address] [place], [address] Phone: [phone]   Fax:  [phone]  Name: Markeeta Holtzer MRN: [id number] Date of Birth: [birth year]

## 2016-03-24 ENCOUNTER — Ambulatory Visit: Payer: 59 | Admitting: Physical Therapy

## 2016-03-24 DIAGNOSIS — M546 Pain in thoracic spine: Secondary | ICD-10-CM | POA: Diagnosis not present

## 2016-03-24 DIAGNOSIS — M542 Cervicalgia: Secondary | ICD-10-CM

## 2016-03-24 DIAGNOSIS — M25512 Pain in left shoulder: Secondary | ICD-10-CM

## 2016-03-24 NOTE — Therapy (Signed)
[facility] [address] [place], [address] Phone: [phone]   Fax:  [phone]  Physical Therapy Treatment  Patient Details  Name: Jessica Melendez MRN: [id number] Date of Birth: [birth year] No Data Recorded  Encounter Date: 03/24/2016      PT End of Session - 03/24/16 0957    Visit Number 3   Number of Visits 9   Date for PT Re-Evaluation 04/14/16   PT Start Time 0953   PT Stop Time 1031   PT Time Calculation (min) 38 min   Activity Tolerance Patient tolerated treatment well   Behavior During Therapy [facility] for tasks assessed/performed      No past medical history on file.  No past surgical history on file.  There were no vitals filed for this visit.      Subjective Assessment - 03/24/16 0954    Subjective Patient reports she has not had any shoulder pain over the weekend. She did notice more back in her mid to upper back over the weekend, more painful than stiffness. No problems with exercises she reports.    Limitations Lifting   Diagnostic tests X-ray did not show any fractures.    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    Currently in Pain? --  If she applies pressure on her thoracic/cervical spine but otherwise is not sitting around in back pain.   Pain Location Back   Pain Orientation Upper;Mid   Pain Type Chronic pain   Pain Onset More than a month ago   Pain Frequency Intermittent      CPAs grade IV most tender around bra line, mild pain in lower thoracic spine 4-5 bouts for 30" at each tender area   Seated thoracic rotations bilaterally 2 bouts x 5 repetitions (reported this was the most relieving maneuver performed in PT thus far).   Soft tissue mobilizations to tender area in L thoracic spine around inferior angle of scapula -- reported decline of all residual symptoms afterwards.   Provided education and demonstration for  self-rotation mobilizations of thoracic spine (patient reported similar sensation as PT guided rotations with relief of symptoms).                            PT Education - 03/24/16 1032    Education provided Yes   Education Details Complete seated thoracic rotations as needed over the next few days.    Person(s) Educated Patient   Methods Explanation;Demonstration;Handout   Comprehension Verbalized understanding;Returned demonstration             PT Long Term Goals - 03/18/16 1546      PT LONG TERM GOAL #1   Title Patient will report a QuickDash score of less than 15% to demonstrate tolerance for ADLs.    Baseline Did not fill out    Time 4   Period Weeks   Status New     PT LONG TERM GOAL #2   Title Patient will demonstrate ability to flex LUE to at least 170 degrees flexion to demonstrate improved AROM for ADLs.    Baseline 155   Time 4   Period Weeks   Status New     PT LONG TERM GOAL #3   Title Patient will report worst pain VAS score of less than 3/10 to demonstrate improved tolerance for ADLs.    Baseline  8/10   Time 4   Period Weeks   Status New     PT LONG TERM GOAL #4   Title Patient will be able to lift at least 20# with no increase in symptoms to return to work related functions.    Time 4   Period Weeks   Status New               Plan - 03/24/16 1033    Clinical Impression Statement Patient is reporting no shoulder discomfort, appears she has centralized all symptoms to around bra-strap line of thoracic spine. She reports significant relief with soft tissue mobilization and CPAs to tender area, with reported resolution of ache/pain that was present initialy.    Rehab Potential Excellent   Clinical Impairments Affecting Rehab Potential [place] response to therapy, young/healthy, acute onset of pain.    PT Frequency 2x / week   PT Duration 4 weeks   PT Treatment/Interventions Aquatic Therapy;Biofeedback;Cryotherapy;Electrical  Stimulation;Manual techniques;Dry needling;Taping;Patient/family education;Therapeutic exercise;Therapeutic activities;Traction   PT Next Visit Plan Joint mobilizations/thoracic manipulation, soft tissue mobilization as needed.    PT Home Exercise Plan Seated thoracic extension   Consulted and Agree with Plan of Care Patient      Patient will benefit from skilled therapeutic intervention in order to improve the following deficits and impairments:  Impaired UE functional use, Pain, Decreased strength  Visit Diagnosis: Cervicalgia  Pain in thoracic spine  Pain in left shoulder     Problem List Patient Active Problem List   Diagnosis Date Noted  . Contraception management 08/22/2014  . Well woman exam with routine gynecological exam 08/22/2014   Kerman Passey, PT, DPT    03/24/2016, 10:35 AM  [facility] [address], [place], [address] Phone: [phone]   Fax:  [phone]  Name: Jessica Melendez MRN: [id number] Date of Birth: [birth year]

## 2016-03-24 NOTE — Patient Instructions (Addendum)
CPAs grade IV most tender around bra line, mild pain in lower thoracic spine 4-5 bouts for 30" at each tender area   Seated thoracic rotations bilaterally 2 bouts x 5 repetitions   Soft tissue mobilizations to tender area in L thoracic spine around inferior angle of scapula   Provided education and demonstration for self-rotation mobilizations of thoracic spine (patient reported similar sensation as PT guided rotations with relief of symptoms).

## 2016-03-27 ENCOUNTER — Ambulatory Visit: Payer: 59 | Admitting: Physical Therapy

## 2016-03-31 ENCOUNTER — Encounter: Payer: Self-pay | Admitting: Physical Therapy

## 2016-03-31 ENCOUNTER — Ambulatory Visit: Payer: 59 | Attending: Primary Care

## 2016-03-31 DIAGNOSIS — M542 Cervicalgia: Secondary | ICD-10-CM | POA: Insufficient documentation

## 2016-03-31 DIAGNOSIS — M25512 Pain in left shoulder: Secondary | ICD-10-CM | POA: Insufficient documentation

## 2016-03-31 DIAGNOSIS — M546 Pain in thoracic spine: Secondary | ICD-10-CM | POA: Insufficient documentation

## 2016-03-31 NOTE — Therapy (Signed)
[facility] [address] [place], [address] Phone: [phone]   Fax:  [phone]  Physical Therapy Treatment/Discharge Summary  Patient Details  Name: Jessica Melendez MRN: [id number] Date of Birth: 1990/10/07 No Data Recorded  Encounter Date: 03/31/2016      PT End of Session - 03/31/16 1310    Visit Number 4   Number of Visits 9   Date for PT Re-Evaluation 04/14/16   PT Start Time 0955   PT Stop Time 1025   PT Time Calculation (min) 30 min   Activity Tolerance Patient tolerated treatment well   Behavior During Therapy [facility] for tasks assessed/performed      History reviewed. No pertinent past medical history.  History reviewed. No pertinent surgical history.  There were no vitals filed for this visit.      Subjective Assessment - 03/31/16 0954    Subjective Pt denies any further pain but reports continued soreness in her upper back. She feels like she has made adequate progress and would like for today to be her last therapy session. No specific questions or concerns at this time.     Limitations Lifting   Diagnostic tests X-ray did not show any fractures.    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    Currently in Pain? No/denies  Denies pain. Reports mild upper back soreness   Pain Onset --       TREATMENT  PHYSICAL PERFORMANCE Discussed results of quick DASH with patient; Updated goals and discussed plan of care/discharge; Reinforced HEP  MANUAL THERAPY CPAs grade 3, 30 seconds/bout, 3 bouts/level T2-T4 with positive reproduction of pain; STM to L rhomboids x 2 minutes; Pt instructed in foam rolling for self soft tissue mobilization to mid/upper thoracic spine; Pt performed supine extensions over foam roller for upper/mid thoracic spine with 3 repetitions/segment; Pt perform supine thoracic rotation over foam roller for upper/mid  thoracic spine with 3 repetitions/segment; Reviewed seated thoracic rotations with patient;                        PT Education - 03/31/16 1308    Education provided Yes   Education Details Discharge. HEP reinforced.    Person(s) Educated Patient   Methods Explanation;Demonstration   Comprehension Verbalized understanding;Returned demonstration             PT Long Term Goals - 03/31/16 0959      PT LONG TERM GOAL #1   Title Patient will report a QuickDash score of less than 15% to demonstrate tolerance for ADLs.    Baseline Did not fill out, 03/31/16: 0%   Time 4   Period Weeks   Status Achieved     PT LONG TERM GOAL #2   Title Patient will demonstrate ability to flex LUE to at least 170 degrees flexion to demonstrate improved AROM for ADLs.    Baseline 155; 03/31/16: LUE 156 flexion, RUE: 162 flexion;    Time 4   Period Weeks   Status On-going     PT LONG TERM GOAL #3   Title Patient will report worst pain VAS score of less than 3/10 to demonstrate improved tolerance for ADLs.    Baseline 8/10, 03/31/09: worst: 1/10, present: 0/10   Time 4   Period Weeks   Status Achieved     PT LONG TERM GOAL #4   Title Patient will  be able to lift at least 20# with no increase in symptoms to return to work related functions.    Time 4   Period Weeks   Status New               Plan - 03/31/16 1310    Clinical Impression Statement Pt reports no further shoulder or neck pain currently during the course of her regular activities. She reports some mild pain at very end range L shoulder flexion but does not impair her functionally. Very minimal deficits in L shoulder AROM compared to R shoulder. Pt reports 0% disability on quick DASH questionnaire. She has met all of her goals and is requesting discharge on this date. Pt will be discharged today having met most of her goals and in anticipation of her starting a graduate program next week in [place].    Rehab  Potential Excellent   Clinical Impairments Affecting Rehab Potential [place] response to therapy, young/healthy, acute onset of pain.    PT Frequency 2x / week   PT Duration 4 weeks   PT Treatment/Interventions Aquatic Therapy;Biofeedback;Cryotherapy;Electrical Stimulation;Manual techniques;Dry needling;Taping;Patient/family education;Therapeutic exercise;Therapeutic activities;Traction   PT Next Visit Plan Discharge   PT Home Exercise Plan Seated thoracic extension, foam rolling, L shoulder strengthening   Consulted and Agree with Plan of Care Patient      Patient will benefit from skilled therapeutic intervention in order to improve the following deficits and impairments:  Impaired UE functional use, Pain, Decreased strength  Visit Diagnosis: Cervicalgia  Pain in thoracic spine  Pain in left shoulder     Problem List Patient Active Problem List   Diagnosis Date Noted  . Contraception management 08/22/2014  . Well woman exam with routine gynecological exam 08/22/2014    Phillips Grout PT, DPT   Neidy Guerrieri 03/31/2016, 1:26 PM  [facility] [address] [place], [address] Phone: [phone]   Fax:  [phone]  Name: Talyia Allende MRN: [id number] Date of Birth: [birth year]

## 2016-04-03 ENCOUNTER — Encounter: Payer: 59 | Admitting: Physical Therapy

## 2016-06-09 ENCOUNTER — Ambulatory Visit (INDEPENDENT_AMBULATORY_CARE_PROVIDER_SITE_OTHER): Payer: 59 | Admitting: Family Medicine

## 2016-06-09 ENCOUNTER — Encounter: Payer: Self-pay | Admitting: *Deleted

## 2016-06-09 ENCOUNTER — Encounter: Payer: Self-pay | Admitting: Family Medicine

## 2016-06-09 VITALS — BP 117/72 | HR 56 | Resp 18 | Ht 67.0 in | Wt 166.0 lb

## 2016-06-09 DIAGNOSIS — Z113 Encounter for screening for infections with a predominantly sexual mode of transmission: Secondary | ICD-10-CM | POA: Diagnosis not present

## 2016-06-09 DIAGNOSIS — Z3041 Encounter for surveillance of contraceptive pills: Secondary | ICD-10-CM | POA: Diagnosis not present

## 2016-06-09 MED ORDER — LEVONORGEST-ETH ESTRAD 91-DAY 0.15-0.03 &0.01 MG PO TABS: 1.0000 | ORAL_TABLET | Freq: Every day | ORAL | 5 refills | Status: DC

## 2016-06-09 NOTE — Progress Notes (Signed)
   Subjective:    Patient ID: Jessica Melendez is a 25 y.o. female presenting with Gynecologic Exam  on 06/09/2016  HPI: Patient here for OCP refill. She is in grad school for [employer] Had normal pap in 2016. Needs annual GC/Chlam testing. Using condoms.  Review of Systems  Constitutional: Negative for chills and fever.  Respiratory: Negative for shortness of breath.   Cardiovascular: Negative for chest pain.  Gastrointestinal: Negative for abdominal pain, nausea and vomiting.  Genitourinary: Negative for dysuria.  Skin: Negative for rash.      Objective:    BP 117/72 (BP Location: Left Arm, Patient Position: Sitting, Cuff Size: Normal)   Pulse (!) 56   Resp 18   Ht 5\' 7"  (1.702 m)   Wt 166 lb (75.3 kg)   LMP 04/14/2016   BMI 26.00 kg/m  Physical Exam  Constitutional: She is oriented to person, place, and time. She appears well-developed and well-nourished. No distress.  HENT:  Head: Normocephalic and atraumatic.  Eyes: No scleral icterus.  Neck: Neck supple.  Cardiovascular: Normal rate.   Pulmonary/Chest: Effort normal.  Abdominal: Soft.  Neurological: She is alert and oriented to person, place, and time.  Skin: Skin is warm and dry.  Psychiatric: She has a normal mood and affect.        Assessment & Plan:  Encounter for surveillance of contraceptive pills - Plan: Levonorgestrel-Ethinyl Estradiol (AMETHIA,CAMRESE) 0.15-0.03 &0.01 MG tablet  Screen for STD (sexually transmitted disease) - Plan: Urine cytology ancillary only   Total face-to-face time with patient: 15 minutes. Over 50% of encounter was spent on counseling and coordination of care. Return in 1 year (on 06/09/2017).  Donnamae Jude 06/09/2016 1:50 PM

## 2016-06-09 NOTE — Patient Instructions (Signed)
Preventive Care for Adults, Female A healthy lifestyle and preventive care can promote health and wellness. Preventive health guidelines for women include the following key practices.  A routine yearly physical is a good way to check with your health care provider about your health and preventive screening. It is a chance to share any concerns and updates on your health and to receive a thorough exam.  Visit your dentist for a routine exam and preventive care every 6 months. Brush your teeth twice a day and floss once a day. Good oral hygiene prevents tooth decay and gum disease.  The frequency of eye exams is based on your age, health, family medical history, use of contact lenses, and other factors. Follow your health care provider's recommendations for frequency of eye exams.  Eat a healthy diet. Foods like vegetables, fruits, whole grains, low-fat dairy products, and lean protein foods contain the nutrients you need without too many calories. Decrease your intake of foods high in solid fats, added sugars, and salt. Eat the right amount of calories for you.Get information about a proper diet from your health care provider, if necessary.  Regular physical exercise is one of the most important things you can do for your health. Most adults should get at least 150 minutes of moderate-intensity exercise (any activity that increases your heart rate and causes you to sweat) each week. In addition, most adults need muscle-strengthening exercises on 2 or more days a week.  Maintain a healthy weight. The body mass index (BMI) is a screening tool to identify possible weight problems. It provides an estimate of body fat based on height and weight. Your health care provider can find your BMI and can help you achieve or maintain a healthy weight.For adults 20 years and older:  A BMI below 18.5 is considered underweight.  A BMI of 18.5 to 24.9 is normal.  A BMI of 25 to 29.9 is considered overweight.  A  BMI of 30 and above is considered obese.  Maintain normal blood lipids and cholesterol levels by exercising and minimizing your intake of saturated fat. Eat a balanced diet with plenty of fruit and vegetables. Blood tests for lipids and cholesterol should begin at age 45 and be repeated every 5 years. If your lipid or cholesterol levels are high, you are over 50, or you are at high risk for heart disease, you may need your cholesterol levels checked more frequently.Ongoing high lipid and cholesterol levels should be treated with medicines if diet and exercise are not working.  If you smoke, find out from your health care provider how to quit. If you do not use tobacco, do not start.  Lung cancer screening is recommended for adults aged 45-80 years who are at high risk for developing lung cancer because of a history of smoking. A yearly low-dose CT scan of the lungs is recommended for people who have at least a 30-pack-year history of smoking and are a current smoker or have quit within the past 15 years. A pack year of smoking is smoking an average of 1 pack of cigarettes a day for 1 year (for example: 1 pack a day for 30 years or 2 packs a day for 15 years). Yearly screening should continue until the smoker has stopped smoking for at least 15 years. Yearly screening should be stopped for people who develop a health problem that would prevent them from having lung cancer treatment.  If you are pregnant, do not drink alcohol. If you are  breastfeeding, be very cautious about drinking alcohol. If you are not pregnant and choose to drink alcohol, do not have more than 1 drink per day. One drink is considered to be 12 ounces (355 mL) of beer, 5 ounces (148 mL) of wine, or 1.5 ounces (44 mL) of liquor.  Avoid use of street drugs. Do not share needles with anyone. Ask for help if you need support or instructions about stopping the use of drugs.  High blood pressure causes heart disease and increases the risk  of stroke. Your blood pressure should be checked at least every 1 to 2 years. Ongoing high blood pressure should be treated with medicines if weight loss and exercise do not work.  If you are 55-79 years old, ask your health care provider if you should take aspirin to prevent strokes.  Diabetes screening is done by taking a blood sample to check your blood glucose level after you have not eaten for a certain period of time (fasting). If you are not overweight and you do not have risk factors for diabetes, you should be screened once every 3 years starting at age 45. If you are overweight or obese and you are 40-70 years of age, you should be screened for diabetes every year as part of your cardiovascular risk assessment.  Breast cancer screening is essential preventive care for women. You should practice "breast self-awareness." This means understanding the normal appearance and feel of your breasts and may include breast self-examination. Any changes detected, no matter how small, should be reported to a health care provider. Women in their 20s and 30s should have a clinical breast exam (CBE) by a health care provider as part of a regular health exam every 1 to 3 years. After age 40, women should have a CBE every year. Starting at age 40, women should consider having a mammogram (breast X-ray test) every year. Women who have a family history of breast cancer should talk to their health care provider about genetic screening. Women at a high risk of breast cancer should talk to their health care providers about having an MRI and a mammogram every year.  Breast cancer gene (BRCA)-related cancer risk assessment is recommended for women who have family members with BRCA-related cancers. BRCA-related cancers include breast, ovarian, tubal, and peritoneal cancers. Having family members with these cancers may be associated with an increased risk for harmful changes (mutations) in the breast cancer genes BRCA1 and  BRCA2. Results of the assessment will determine the need for genetic counseling and BRCA1 and BRCA2 testing.  Your health care provider may recommend that you be screened regularly for cancer of the pelvic organs (ovaries, uterus, and vagina). This screening involves a pelvic examination, including checking for microscopic changes to the surface of your cervix (Pap test). You may be encouraged to have this screening done every 3 years, beginning at age 21.  For women ages 30-65, health care providers may recommend pelvic exams and Pap testing every 3 years, or they may recommend the Pap and pelvic exam, combined with testing for human papilloma virus (HPV), every 5 years. Some types of HPV increase your risk of cervical cancer. Testing for HPV may also be done on women of any age with unclear Pap test results.  Other health care providers may not recommend any screening for nonpregnant women who are considered low risk for pelvic cancer and who do not have symptoms. Ask your health care provider if a screening pelvic exam is right for   you.  If you have had past treatment for cervical cancer or a condition that could lead to cancer, you need Pap tests and screening for cancer for at least 20 years after your treatment. If Pap tests have been discontinued, your risk factors (such as having a new sexual partner) need to be reassessed to determine if screening should resume. Some women have medical problems that increase the chance of getting cervical cancer. In these cases, your health care provider may recommend more frequent screening and Pap tests.  Colorectal cancer can be detected and often prevented. Most routine colorectal cancer screening begins at the age of 50 years and continues through age 75 years. However, your health care provider may recommend screening at an earlier age if you have risk factors for colon cancer. On a yearly basis, your health care provider may provide home test kits to check  for hidden blood in the stool. Use of a small camera at the end of a tube, to directly examine the colon (sigmoidoscopy or colonoscopy), can detect the earliest forms of colorectal cancer. Talk to your health care provider about this at age 50, when routine screening begins. Direct exam of the colon should be repeated every 5-10 years through age 75 years, unless early forms of precancerous polyps or small growths are found.  People who are at an increased risk for hepatitis B should be screened for this virus. You are considered at high risk for hepatitis B if:  You were born in a country where hepatitis B occurs often. Talk with your health care provider about which countries are considered high risk.  Your parents were born in a high-risk country and you have not received a shot to protect against hepatitis B (hepatitis B vaccine).  You have HIV or AIDS.  You use needles to inject street drugs.  You live with, or have sex with, someone who has hepatitis B.  You get hemodialysis treatment.  You take certain medicines for conditions like cancer, organ transplantation, and autoimmune conditions.  Hepatitis C blood testing is recommended for all people born from 1945 through 1965 and any individual with known risks for hepatitis C.  Practice safe sex. Use condoms and avoid high-risk sexual practices to reduce the spread of sexually transmitted infections (STIs). STIs include gonorrhea, chlamydia, syphilis, trichomonas, herpes, HPV, and human immunodeficiency virus (HIV). Herpes, HIV, and HPV are viral illnesses that have no cure. They can result in disability, cancer, and death.  You should be screened for sexually transmitted illnesses (STIs) including gonorrhea and chlamydia if:  You are sexually active and are younger than 24 years.  You are older than 24 years and your health care provider tells you that you are at risk for this type of infection.  Your sexual activity has changed  since you were last screened and you are at an increased risk for chlamydia or gonorrhea. Ask your health care provider if you are at risk.  If you are at risk of being infected with HIV, it is recommended that you take a prescription medicine daily to prevent HIV infection. This is called preexposure prophylaxis (PrEP). You are considered at risk if:  You are sexually active and do not regularly use condoms or know the HIV status of your partner(s).  You take drugs by injection.  You are sexually active with a partner who has HIV.  Talk with your health care provider about whether you are at high risk of being infected with HIV. If   you choose to begin PrEP, you should first be tested for HIV. You should then be tested every 3 months for as long as you are taking PrEP.  Osteoporosis is a disease in which the bones lose minerals and strength with aging. This can result in serious bone fractures or breaks. The risk of osteoporosis can be identified using a bone density scan. Women ages 67 years and over and women at risk for fractures or osteoporosis should discuss screening with their health care providers. Ask your health care provider whether you should take a calcium supplement or vitamin D to reduce the rate of osteoporosis.  Menopause can be associated with physical symptoms and risks. Hormone replacement therapy is available to decrease symptoms and risks. You should talk to your health care provider about whether hormone replacement therapy is right for you.  Use sunscreen. Apply sunscreen liberally and repeatedly throughout the day. You should seek shade when your shadow is shorter than you. Protect yourself by wearing long sleeves, pants, a wide-brimmed hat, and sunglasses year round, whenever you are outdoors.  Once a month, do a whole body skin exam, using a mirror to look at the skin on your back. Tell your health care provider of new moles, moles that have irregular borders, moles that  are larger than a pencil eraser, or moles that have changed in shape or color.  Stay current with required vaccines (immunizations).  Influenza vaccine. All adults should be immunized every year.  Tetanus, diphtheria, and acellular pertussis (Td, Tdap) vaccine. Pregnant women should receive 1 dose of Tdap vaccine during each pregnancy. The dose should be obtained regardless of the length of time since the last dose. Immunization is preferred during the 27th-36th week of gestation. An adult who has not previously received Tdap or who does not know her vaccine status should receive 1 dose of Tdap. This initial dose should be followed by tetanus and diphtheria toxoids (Td) booster doses every 10 years. Adults with an unknown or incomplete history of completing a 3-dose immunization series with Td-containing vaccines should begin or complete a primary immunization series including a Tdap dose. Adults should receive a Td booster every 10 years.  Varicella vaccine. An adult without evidence of immunity to varicella should receive 2 doses or a second dose if she has previously received 1 dose. Pregnant females who do not have evidence of immunity should receive the first dose after pregnancy. This first dose should be obtained before leaving the health care facility. The second dose should be obtained 4-8 weeks after the first dose.  Human papillomavirus (HPV) vaccine. Females aged 13-26 years who have not received the vaccine previously should obtain the 3-dose series. The vaccine is not recommended for use in pregnant females. However, pregnancy testing is not needed before receiving a dose. If a female is found to be pregnant after receiving a dose, no treatment is needed. In that case, the remaining doses should be delayed until after the pregnancy. Immunization is recommended for any person with an immunocompromised condition through the age of 61 years if she did not get any or all doses earlier. During the  3-dose series, the second dose should be obtained 4-8 weeks after the first dose. The third dose should be obtained 24 weeks after the first dose and 16 weeks after the second dose.  Zoster vaccine. One dose is recommended for adults aged 30 years or older unless certain conditions are present.  Measles, mumps, and rubella (MMR) vaccine. Adults born  before 1957 generally are considered immune to measles and mumps. Adults born in 1957 or later should have 1 or more doses of MMR vaccine unless there is a contraindication to the vaccine or there is laboratory evidence of immunity to each of the three diseases. A routine second dose of MMR vaccine should be obtained at least 28 days after the first dose for students attending postsecondary schools, health care workers, or international travelers. People who received inactivated measles vaccine or an unknown type of measles vaccine during 1963-1967 should receive 2 doses of MMR vaccine. People who received inactivated mumps vaccine or an unknown type of mumps vaccine before 1979 and are at high risk for mumps infection should consider immunization with 2 doses of MMR vaccine. For females of childbearing age, rubella immunity should be determined. If there is no evidence of immunity, females who are not pregnant should be vaccinated. If there is no evidence of immunity, females who are pregnant should delay immunization until after pregnancy. Unvaccinated health care workers born before 1957 who lack laboratory evidence of measles, mumps, or rubella immunity or laboratory confirmation of disease should consider measles and mumps immunization with 2 doses of MMR vaccine or rubella immunization with 1 dose of MMR vaccine.  Pneumococcal 13-valent conjugate (PCV13) vaccine. When indicated, a person who is uncertain of his immunization history and has no record of immunization should receive the PCV13 vaccine. All adults 65 years of age and older should receive this  vaccine. An adult aged 19 years or older who has certain medical conditions and has not been previously immunized should receive 1 dose of PCV13 vaccine. This PCV13 should be followed with a dose of pneumococcal polysaccharide (PPSV23) vaccine. Adults who are at high risk for pneumococcal disease should obtain the PPSV23 vaccine at least 8 weeks after the dose of PCV13 vaccine. Adults older than 23 years of age who have normal immune system function should obtain the PPSV23 vaccine dose at least 1 year after the dose of PCV13 vaccine.  Pneumococcal polysaccharide (PPSV23) vaccine. When PCV13 is also indicated, PCV13 should be obtained first. All adults aged 65 years and older should be immunized. An adult younger than age 65 years who has certain medical conditions should be immunized. Any person who resides in a nursing home or long-term care facility should be immunized. An adult smoker should be immunized. People with an immunocompromised condition and certain other conditions should receive both PCV13 and PPSV23 vaccines. People with human immunodeficiency virus (HIV) infection should be immunized as soon as possible after diagnosis. Immunization during chemotherapy or radiation therapy should be avoided. Routine use of PPSV23 vaccine is not recommended for American Indians, [place] Natives, or people younger than 65 years unless there are medical conditions that require PPSV23 vaccine. When indicated, people who have unknown immunization and have no record of immunization should receive PPSV23 vaccine. One-time revaccination 5 years after the first dose of PPSV23 is recommended for people aged 19-64 years who have chronic kidney failure, nephrotic syndrome, asplenia, or immunocompromised conditions. People who received 1-2 doses of PPSV23 before age 65 years should receive another dose of PPSV23 vaccine at age 65 years or later if at least 5 years have passed since the previous dose. Doses of PPSV23 are not  needed for people immunized with PPSV23 at or after age 65 years.  Meningococcal vaccine. Adults with asplenia or persistent complement component deficiencies should receive 2 doses of quadrivalent meningococcal conjugate (MenACWY-D) vaccine. The doses should be obtained   at least 2 months apart. Microbiologists working with certain meningococcal bacteria, [place] recruits, people at risk during an outbreak, and people who travel to or live in countries with a high rate of meningitis should be immunized. A first-year college student up through age 34 years who is living in a residence hall should receive a dose if she did not receive a dose on or after her 16th birthday. Adults who have certain high-risk conditions should receive one or more doses of vaccine.  Hepatitis A vaccine. Adults who wish to be protected from this disease, have certain high-risk conditions, work with hepatitis A-infected animals, work in hepatitis A research labs, or travel to or work in countries with a high rate of hepatitis A should be immunized. Adults who were previously unvaccinated and who anticipate close contact with an international adoptee during the first 60 days after arrival in the [place] from a country with a high rate of hepatitis A should be immunized.  Hepatitis B vaccine. Adults who wish to be protected from this disease, have certain high-risk conditions, may be exposed to blood or other infectious body fluids, are household contacts or sex partners of hepatitis B positive people, are clients or workers in certain care facilities, or travel to or work in countries with a high rate of hepatitis B should be immunized.  Haemophilus influenzae type b (Hib) vaccine. A previously unvaccinated person with asplenia or sickle cell disease or having a scheduled splenectomy should receive 1 dose of Hib vaccine. Regardless of previous immunization, a recipient of a hematopoietic stem cell transplant should receive a  3-dose series 6-12 months after her successful transplant. Hib vaccine is not recommended for adults with HIV infection. Preventive Services / Frequency Ages 35 to 4 years  Blood pressure check.** / Every 3-5 years.  Lipid and cholesterol check.** / Every 5 years beginning at age 60.  Clinical breast exam.** / Every 3 years for women in their 71s and 10s.  BRCA-related cancer risk assessment.** / For women who have family members with a BRCA-related cancer (breast, ovarian, tubal, or peritoneal cancers).  Pap test.** / Every 2 years from ages 76 through 26. Every 3 years starting at age 61 through age 76 or 93 with a history of 3 consecutive normal Pap tests.  HPV screening.** / Every 3 years from ages 37 through ages 60 to 51 with a history of 3 consecutive normal Pap tests.  Hepatitis C blood test.** / For any individual with known risks for hepatitis C.  Skin self-exam. / Monthly.  Influenza vaccine. / Every year.  Tetanus, diphtheria, and acellular pertussis (Tdap, Td) vaccine.** / Consult your health care provider. Pregnant women should receive 1 dose of Tdap vaccine during each pregnancy. 1 dose of Td every 10 years.  Varicella vaccine.** / Consult your health care provider. Pregnant females who do not have evidence of immunity should receive the first dose after pregnancy.  HPV vaccine. / 3 doses over 6 months, if 93 and younger. The vaccine is not recommended for use in pregnant females. However, pregnancy testing is not needed before receiving a dose.  Measles, mumps, rubella (MMR) vaccine.** / You need at least 1 dose of MMR if you were born in 1957 or later. You may also need a 2nd dose. For females of childbearing age, rubella immunity should be determined. If there is no evidence of immunity, females who are not pregnant should be vaccinated. If there is no evidence of immunity, females who are  pregnant should delay immunization until after pregnancy.  Pneumococcal  13-valent conjugate (PCV13) vaccine.** / Consult your health care provider.  Pneumococcal polysaccharide (PPSV23) vaccine.** / 1 to 2 doses if you smoke cigarettes or if you have certain conditions.  Meningococcal vaccine.** / 1 dose if you are age 68 to 8 years and a Market researcher living in a residence hall, or have one of several medical conditions, you need to get vaccinated against meningococcal disease. You may also need additional booster doses.  Hepatitis A vaccine.** / Consult your health care provider.  Hepatitis B vaccine.** / Consult your health care provider.  Haemophilus influenzae type b (Hib) vaccine.** / Consult your health care provider. Ages 7 to 53 years  Blood pressure check.** / Every year.  Lipid and cholesterol check.** / Every 5 years beginning at age 25 years.  Lung cancer screening. / Every year if you are aged 11-80 years and have a 30-pack-year history of smoking and currently smoke or have quit within the past 15 years. Yearly screening is stopped once you have quit smoking for at least 15 years or develop a health problem that would prevent you from having lung cancer treatment.  Clinical breast exam.** / Every year after age 48 years.  BRCA-related cancer risk assessment.** / For women who have family members with a BRCA-related cancer (breast, ovarian, tubal, or peritoneal cancers).  Mammogram.** / Every year beginning at age 41 years and continuing for as long as you are in good health. Consult with your health care provider.  Pap test.** / Every 3 years starting at age 65 years through age 37 or 70 years with a history of 3 consecutive normal Pap tests.  HPV screening.** / Every 3 years from ages 72 years through ages 60 to 40 years with a history of 3 consecutive normal Pap tests.  Fecal occult blood test (FOBT) of stool. / Every year beginning at age 21 years and continuing until age 5 years. You may not need to do this test if you get  a colonoscopy every 10 years.  Flexible sigmoidoscopy or colonoscopy.** / Every 5 years for a flexible sigmoidoscopy or every 10 years for a colonoscopy beginning at age 35 years and continuing until age 48 years.  Hepatitis C blood test.** / For all people born from [birth year] through 1965 and any individual with known risks for hepatitis C.  Skin self-exam. / Monthly.  Influenza vaccine. / Every year.  Tetanus, diphtheria, and acellular pertussis (Tdap/Td) vaccine.** / Consult your health care provider. Pregnant women should receive 1 dose of Tdap vaccine during each pregnancy. 1 dose of Td every 10 years.  Varicella vaccine.** / Consult your health care provider. Pregnant females who do not have evidence of immunity should receive the first dose after pregnancy.  Zoster vaccine.** / 1 dose for adults aged 30 years or older.  Measles, mumps, rubella (MMR) vaccine.** / You need at least 1 dose of MMR if you were born in 1957 or later. You may also need a second dose. For females of childbearing age, rubella immunity should be determined. If there is no evidence of immunity, females who are not pregnant should be vaccinated. If there is no evidence of immunity, females who are pregnant should delay immunization until after pregnancy.  Pneumococcal 13-valent conjugate (PCV13) vaccine.** / Consult your health care provider.  Pneumococcal polysaccharide (PPSV23) vaccine.** / 1 to 2 doses if you smoke cigarettes or if you have certain conditions.  Meningococcal vaccine.** /  Consult your health care provider.  Hepatitis A vaccine.** / Consult your health care provider.  Hepatitis B vaccine.** / Consult your health care provider.  Haemophilus influenzae type b (Hib) vaccine.** / Consult your health care provider. Ages 64 years and over  Blood pressure check.** / Every year.  Lipid and cholesterol check.** / Every 5 years beginning at age 23 years.  Lung cancer screening. / Every year if you  are aged 16-80 years and have a 30-pack-year history of smoking and currently smoke or have quit within the past 15 years. Yearly screening is stopped once you have quit smoking for at least 15 years or develop a health problem that would prevent you from having lung cancer treatment.  Clinical breast exam.** / Every year after age 74 years.  BRCA-related cancer risk assessment.** / For women who have family members with a BRCA-related cancer (breast, ovarian, tubal, or peritoneal cancers).  Mammogram.** / Every year beginning at age 44 years and continuing for as long as you are in good health. Consult with your health care provider.  Pap test.** / Every 3 years starting at age 58 years through age 22 or 39 years with 3 consecutive normal Pap tests. Testing can be stopped between 65 and 70 years with 3 consecutive normal Pap tests and no abnormal Pap or HPV tests in the past 10 years.  HPV screening.** / Every 3 years from ages 64 years through ages 70 or 61 years with a history of 3 consecutive normal Pap tests. Testing can be stopped between 65 and 70 years with 3 consecutive normal Pap tests and no abnormal Pap or HPV tests in the past 10 years.  Fecal occult blood test (FOBT) of stool. / Every year beginning at age 40 years and continuing until age 27 years. You may not need to do this test if you get a colonoscopy every 10 years.  Flexible sigmoidoscopy or colonoscopy.** / Every 5 years for a flexible sigmoidoscopy or every 10 years for a colonoscopy beginning at age 7 years and continuing until age 32 years.  Hepatitis C blood test.** / For all people born from [birth year] through 1965 and any individual with known risks for hepatitis C.  Osteoporosis screening.** / A one-time screening for women ages 30 years and over and women at risk for fractures or osteoporosis.  Skin self-exam. / Monthly.  Influenza vaccine. / Every year.  Tetanus, diphtheria, and acellular pertussis (Tdap/Td)  vaccine.** / 1 dose of Td every 10 years.  Varicella vaccine.** / Consult your health care provider.  Zoster vaccine.** / 1 dose for adults aged 35 years or older.  Pneumococcal 13-valent conjugate (PCV13) vaccine.** / Consult your health care provider.  Pneumococcal polysaccharide (PPSV23) vaccine.** / 1 dose for all adults aged 46 years and older.  Meningococcal vaccine.** / Consult your health care provider.  Hepatitis A vaccine.** / Consult your health care provider.  Hepatitis B vaccine.** / Consult your health care provider.  Haemophilus influenzae type b (Hib) vaccine.** / Consult your health care provider. ** Family history and personal history of risk and conditions may change your health care provider's recommendations.   This information is not intended to replace advice given to you by your health care provider. Make sure you discuss any questions you have with your health care provider.   Document Released: 10/07/2001 Document Revised: 09/01/2014 Document Reviewed: 01/06/2011 Elsevier Interactive Patient Education [employer].

## 2016-06-10 LAB — URINE CYTOLOGY ANCILLARY ONLY
Chlamydia: NEGATIVE
Neisseria Gonorrhea: NEGATIVE

## 2016-06-11 ENCOUNTER — Telehealth: Payer: Self-pay | Admitting: *Deleted

## 2016-06-11 NOTE — Telephone Encounter (Signed)
Called pt to adv labs are normal. Pt expressed understanding.

## 2016-06-11 NOTE — Telephone Encounter (Signed)
-----   Message from Donnamae Jude, MD sent at 06/10/2016  5:24 PM EDT ----- Her cultures are negative.

## 2017-01-14 ENCOUNTER — Telehealth: Payer: 59 | Admitting: Family

## 2017-01-14 DIAGNOSIS — J069 Acute upper respiratory infection, unspecified: Secondary | ICD-10-CM | POA: Diagnosis not present

## 2017-01-14 MED ORDER — BENZONATATE 100 MG PO CAPS: 100.0000 mg | ORAL_CAPSULE | Freq: Three times a day (TID) | ORAL | 0 refills | Status: DC | PRN

## 2017-01-14 NOTE — Progress Notes (Signed)

## 2017-01-23 ENCOUNTER — Encounter: Payer: Self-pay | Admitting: Primary Care

## 2017-01-23 ENCOUNTER — Ambulatory Visit (INDEPENDENT_AMBULATORY_CARE_PROVIDER_SITE_OTHER): Payer: 59 | Admitting: Primary Care

## 2017-01-23 VITALS — BP 122/80 | HR 56 | Temp 98.5°F | Ht 67.0 in | Wt 182.8 lb

## 2017-01-23 DIAGNOSIS — Z0000 Encounter for general adult medical examination without abnormal findings: Secondary | ICD-10-CM | POA: Diagnosis not present

## 2017-01-23 DIAGNOSIS — Z23 Encounter for immunization: Secondary | ICD-10-CM | POA: Diagnosis not present

## 2017-01-23 NOTE — Progress Notes (Signed)
Subjective:    Patient ID: Jessica Melendez, female    DOB: [birth year], 26 y.o.   MRN: [id number]  HPI  Jessica Melendez is a 26 year old female who presents today for complete physical.  Immunizations: -Tetanus: Unsure -Influenza: Completed last season   Diet: She endorses a fair diet. Breakfast: Fruit, smoothie, granola bar Lunch: Sandwich, chips, wheat thins Dinner: Meat, vegetable, startch Snacks: None Desserts: None Beverages: Water, Gatorade  Exercise: She plans on exercising soon, signed up for a soccer league. Eye exam: Completed several years ago. Dental exam: Has not completed recently. Pap Smear: Completed in 2016, normal. Due in 2019. Follows with GYN.   Review of Systems  Constitutional: Negative for unexpected weight change.  HENT: Negative for rhinorrhea.   Respiratory: Negative for cough and shortness of breath.   Cardiovascular: Negative for chest pain.  Gastrointestinal: Negative for constipation and diarrhea.  Genitourinary: Negative for difficulty urinating and menstrual problem.  Musculoskeletal: Negative for arthralgias and myalgias.  Skin: Negative for rash.  Allergic/Immunologic: Negative for environmental allergies.  Neurological: Negative for dizziness, numbness and headaches.  Psychiatric/Behavioral:       Denies concerns for anxiety or depression       No past medical history on file.   Social History   Social History  . Marital status: Single    Spouse name: N/A  . Number of children: N/A  . Years of education: N/A   Occupational History  . Not on file.   Social History Main Topics  . Smoking status: Never Smoker  . Smokeless tobacco: Never Used  . Alcohol use 0.0 oz/week     Comment: socially  . Drug use: No  . Sexual activity: Yes    Partners: Male    Birth control/ protection: Condom, Pill   Other Topics Concern  . Not on file   Social History Narrative   ** Merged History Encounter **       Single. Highest level of  education in Biology from [place]. She aspires to be a Marine scientist and then possibly to be a NP. 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     No past surgical history on file.  Family History  Problem Relation Age of Onset  . Diabetes Paternal Grandfather     No Known Allergies  No current outpatient prescriptions on file prior to visit.   No current facility-administered medications on file prior to visit.     BP 122/80   Pulse (!) 56   Temp 98.5 F (36.9 C) (Oral)   Ht 5\' 7"  (1.702 m)   Wt 182 lb 12.8 oz (82.9 kg)   LMP 12/23/2016   SpO2 98%   BMI 28.63 kg/m    Objective:   Physical Exam  Constitutional: She is oriented to person, place, and time. She appears well-nourished.  HENT:  Right Ear: Tympanic membrane and ear canal normal.  Left Ear: Tympanic membrane and ear canal normal.  Nose: Nose normal.  Mouth/Throat: Oropharynx is clear and moist.  Eyes: Conjunctivae and EOM are normal. Pupils are equal, round, and reactive to light.  Neck: Neck supple. No thyromegaly present.  Cardiovascular: Normal rate and regular rhythm.   No murmur heard. Pulmonary/Chest: Effort normal and breath sounds normal. She has no rales.  Abdominal: Soft. Bowel sounds are normal. There is no tenderness.  Musculoskeletal: Normal range of motion.  Lymphadenopathy:    She has no cervical adenopathy.  Neurological: She is alert and oriented  to person, place, and time. She has normal reflexes. No cranial nerve deficit.  Skin: Skin is warm and dry. No rash noted.  Psychiatric: She has a normal mood and affect.          Assessment & Plan:

## 2017-01-23 NOTE — Assessment & Plan Note (Addendum)
TD last completed in 2006. Due and provided today. Pap UTD. Discussed the importance of a healthy diet and regular exercise in order for weight loss, and to reduce the risk of other medical problems. Exam unremarkable. No labs needed today. Form completed. Follow up in 1 year for annual exam.

## 2017-01-23 NOTE — Patient Instructions (Signed)
You were provided with a tetanus vaccination today. This will cover you for 10 years.  Continue to work on a healthy diet. Increase vegetables, fruit, whole grains.  Ensure you are consuming 64 ounces of water daily.  Start exercising. You should be getting 150 minutes of moderate intensity exercise weekly.  Follow up in 1 year for your annual exam or sooner if needed.  It was a pleasure to see you today!

## 2017-01-23 NOTE — Addendum Note (Signed)
Addended by: Jacqualin Combes on: 01/23/2017 12:44 PM   Modules accepted: Orders

## 2017-04-01 ENCOUNTER — Encounter: Payer: Self-pay | Admitting: Family Medicine

## 2017-04-01 ENCOUNTER — Ambulatory Visit (INDEPENDENT_AMBULATORY_CARE_PROVIDER_SITE_OTHER): Payer: 59 | Admitting: Family Medicine

## 2017-04-01 VITALS — BP 110/62 | HR 55 | Ht 67.0 in | Wt 188.0 lb

## 2017-04-01 DIAGNOSIS — Z30011 Encounter for initial prescription of contraceptive pills: Secondary | ICD-10-CM

## 2017-04-01 DIAGNOSIS — Z3202 Encounter for pregnancy test, result negative: Secondary | ICD-10-CM | POA: Diagnosis not present

## 2017-04-01 LAB — POCT URINE PREGNANCY: PREG TEST UR: NEGATIVE

## 2017-04-01 MED ORDER — NORGESTIMATE-ETH ESTRADIOL 0.25-35 MG-MCG PO TABS: 1.0000 | ORAL_TABLET | Freq: Every day | ORAL | 4 refills | Status: DC

## 2017-04-01 NOTE — Progress Notes (Signed)
   CLINIC ENCOUNTER NOTE  History:  26 y.o. G0P0000 here today for  [facility] consult. Been on OCP and likes this method. Her previous OCP stopped being covered by her parent's insurance. No interested in another method at this time. Denies history of clotting or bleeding d/o. She denies any abnormal vaginal discharge, bleeding, pelvic pain or other concerns.   History reviewed. No pertinent past medical history.  History reviewed. No pertinent surgical history.  The following portions of the patient's history were reviewed and updated as appropriate: allergies, current medications, past family history, past medical history, past social history, past surgical history and problem list.   Health Maintenance:  Normal pap on 02/2015, next in July 2019 per age  Review of Systems:  Pertinent items noted in HPI and remainder of comprehensive ROS otherwise negative.   Objective:  Physical Exam BP 110/62   Pulse (!) 55   Ht 5\' 7"  (1.702 m)   Wt 188 lb (85.3 kg)   LMP 03/04/2017   BMI 29.44 kg/m  Gen; NAD CV: RR Lung Normal WOB   Labs and Imaging No results found.  Assessment & Plan:  1. Encounter for initial prescription of contraceptive pills No contrainidication for OCPs.  Reviewed LARC in detail and reviewed efficacy of methods in tiered fashion - POCT urine pregnancy - norgestimate-ethinyl estradiol (SPRINTEC 28) 0.25-35 MG-MCG tablet; Take 1 tablet by mouth daily.  Dispense: 3 Package; Refill: 4   Routine preventative health maintenance measures emphasized. Please refer to After Visit Summary for other counseling recommendations.   Return in about 1 year (around 04/01/2018) for Yearly wellness exam.   Total face-to-face time with patient: 15 minutes. Over 50% of encounter was spent on counseling and coordination of care.

## 2017-07-15 ENCOUNTER — Encounter: Payer: Self-pay | Admitting: Primary Care

## 2017-07-15 ENCOUNTER — Ambulatory Visit (INDEPENDENT_AMBULATORY_CARE_PROVIDER_SITE_OTHER): Payer: 59 | Admitting: Primary Care

## 2017-07-15 DIAGNOSIS — E663 Overweight: Secondary | ICD-10-CM | POA: Insufficient documentation

## 2017-07-15 NOTE — Progress Notes (Signed)
Subjective:    Patient ID: Jessica Melendez, female    DOB: 1990-12-20, 26 y.o.   MRN: [id number]  HPI  Jessica Melendez is a 26 year old female with a history of overweight BMI who presents today to discuss her weight. She endorses a 30 pound weight gain over the last 7 months as she was taking night classes, ate a poor diet and was not exercising. In July 2018 she started to work on weight loss through exercise including (kickboxing, running, soccer) three days weekly. She's also been on a low carb diet. Since then she's lost 4-5 pounds and she's frustrated as she's not lost more for as hard as she's worked.   Diet currently consists of:  Breakfast: [place] sausage, eggs, spinach Lunch: [place] wrap, Glass blower/designer: Meat, vegetable, starch Snacks: None Desserts: None Beverages: Coffee with sugar and creamer, water (3 bottles daily).   Exercise: Three days weekly.   Wt Readings from Last 3 Encounters:  07/15/17 189 lb 3.2 oz (85.8 kg)  04/01/17 188 lb (85.3 kg)  01/23/17 182 lb 12.8 oz (82.9 kg)      Review of Systems  Constitutional: Negative for fatigue and unexpected weight change.  Respiratory: Negative for shortness of breath.   Cardiovascular: Negative for chest pain.  Psychiatric/Behavioral:       Stressed with school, denies depression or anxiety       No past medical history on file.   Social History   Socioeconomic History  . Marital status: Single    Spouse name: Not on file  . Number of children: Not on file  . Years of education: Not on file  . Highest education level: Not on file  Social Needs  . Financial resource strain: Not on file  . Food insecurity - worry: Not on file  . Food insecurity - inability: Not on file  . Transportation needs - medical: Not on file  . Transportation needs - non-medical: Not on file  Occupational History  . Not on file  Tobacco Use  . Smoking status: Never Smoker  . Smokeless tobacco: Never Used  Substance and Sexual  Activity  . Alcohol use: Yes    Alcohol/week: 0.0 oz    Comment: socially  . Drug use: No  . Sexual activity: Yes    Partners: Male    Birth control/protection: Condom  Other Topics Concern  . Not on file  Social History Narrative   ** Merged History Encounter **       Single. Highest level of education in Biology from [place]. She aspires to be a Marine scientist and then possibly to be a NP. 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     No past surgical history on file.  Family History  Problem Relation Age of Onset  . Diabetes Paternal Grandfather     No Known Allergies  Current Outpatient Medications on File Prior to Visit  Medication Sig Dispense Refill  . Multiple Vitamins-Minerals (MULTIVITAMIN PO) Take by mouth daily.    . norgestimate-ethinyl estradiol (SPRINTEC 28) 0.25-35 MG-MCG tablet Take 1 tablet by mouth daily. 3 Package 4   No current facility-administered medications on file prior to visit.     BP 114/64   Pulse 64   Temp 98.4 F (36.9 C) (Oral)   Ht 5\' 7"  (1.702 m)   Wt 189 lb 3.2 oz (85.8 kg)   LMP 05/26/2017   SpO2 98%   BMI 29.63 kg/m  Objective:   Physical Exam  Constitutional: She appears well-nourished.  Cardiovascular: Normal rate.  Pulmonary/Chest: Effort normal.  Skin: Skin is warm and dry.  Psychiatric: She has a normal mood and affect.          Assessment & Plan:

## 2017-07-15 NOTE — Patient Instructions (Signed)
Start using free APP's like My Fitness Pal for calorie tracking.   Continue exercising. You should be getting 150 minutes of moderate intensity exercise weekly.  Ensure you are consuming 64 ounces of water daily.  Increase vegetables, fruit, whole grains, lean protein.  Consider joining weight watchers.  Please message me with any questions! It was a pleasure to see you today!

## 2017-07-15 NOTE — Assessment & Plan Note (Signed)
Weight gain since graduate school, poor diet and was not exercising. Diet now seems very reasonable. Commended her on her 4-5 pound weight loss since July 2018. Long discussion today regarding healthy ways to lose weight. Discussed to increase vegetables, fruit, whole grains. Increase water intake. Discussed use of free Apps such as my fitness pal. Also discussed trial of weight watchers.

## 2017-07-29 ENCOUNTER — Telehealth: Payer: 59 | Admitting: Family

## 2017-07-29 DIAGNOSIS — J069 Acute upper respiratory infection, unspecified: Secondary | ICD-10-CM

## 2017-07-29 DIAGNOSIS — R0981 Nasal congestion: Secondary | ICD-10-CM | POA: Diagnosis not present

## 2017-07-29 DIAGNOSIS — R05 Cough: Secondary | ICD-10-CM | POA: Diagnosis not present

## 2017-07-29 DIAGNOSIS — J029 Acute pharyngitis, unspecified: Secondary | ICD-10-CM | POA: Diagnosis not present

## 2017-07-29 MED ORDER — BENZONATATE 100 MG PO CAPS: 100.0000 mg | ORAL_CAPSULE | Freq: Two times a day (BID) | ORAL | 0 refills | Status: DC | PRN

## 2017-07-29 MED ORDER — FLUTICASONE PROPIONATE 50 MCG/ACT NA SUSP: 2.0000 | Freq: Every day | NASAL | 6 refills | Status: DC

## 2017-07-29 NOTE — Progress Notes (Signed)
We are sorry that you are not feeling well.  Here is how we plan to help!  Based on your presentation I believe you most likely have A cough due to allergies.  I recommend that you start the an over-the counter-allergy medication such as Claritin 10 mg or Zyrtec 10 mg daily.   I have also sen in Flonase prescription to your pharmacy.    In addition you may use A non-prescription cough medication called Robitussin DAC. Take 2 teaspoons every 8 hours or Delsym: take 2 teaspoons every 12 hours., A non-prescription cough medication called Mucinex DM: take 2 tablets every 12 hours. and A prescription cough medication called Tessalon Perles 100mg . You may take 1-2 capsules every 8 hours as needed for your cough.  Providers prescribe antibiotics to treat infections caused by bacteria. Antibiotics are very powerful in treating bacterial infections when they are used properly. To maintain their effectiveness, they should be used only when necessary. Overuse of antibiotics has resulted in the development of superbugs that are resistant to treatment!    After careful review of your answers, I would not recommend an antibiotic for your condition.  Antibiotics are not effective against viruses and therefore should not be used to treat them. Common examples of infections caused by viruses include colds and flu   From your responses in the eVisit questionnaire you describe inflammation in the upper respiratory tract which is causing a significant cough.  This is commonly called Bronchitis and has four common causes:    Allergies  Viral Infections  Acid Reflux  Bacterial Infection Allergies, viruses and acid reflux are treated by controlling symptoms or eliminating the cause. An example might be a cough caused by taking certain blood pressure medications. You stop the cough by changing the medication. Another example might be a cough caused by acid reflux. Controlling the reflux helps control the cough.  USE  OF BRONCHODILATOR ("RESCUE") INHALERS: There is a risk from using your bronchodilator too frequently.  The risk is that over-reliance on a medication which only relaxes the muscles surrounding the breathing tubes can reduce the effectiveness of medications prescribed to reduce swelling and congestion of the tubes themselves.  Although you feel brief relief from the bronchodilator inhaler, your asthma may actually be worsening with the tubes becoming more swollen and filled with mucus.  This can delay other crucial treatments, such as oral steroid medications. If you need to use a bronchodilator inhaler daily, several times per day, you should discuss this with your provider.  There are probably better treatments that could be used to keep your asthma under control.     HOME CARE . Only take medications as instructed by your medical team. . Complete the entire course of an antibiotic. . Drink plenty of fluids and get plenty of rest. . Avoid close contacts especially the very young and the elderly . Cover your mouth if you cough or cough into your sleeve. . Always remember to wash your hands . A steam or ultrasonic humidifier can help congestion.   GET HELP RIGHT AWAY IF: . You develop worsening fever. . You become short of breath . You cough up blood. . Your symptoms persist after you have completed your treatment plan MAKE SURE YOU   Understand these instructions.  Will watch your condition.  Will get help right away if you are not doing well or get worse.  Your e-visit answers were reviewed by a board certified advanced clinical practitioner to complete your personal  care plan.  Depending on the condition, your plan could have included both over the counter or prescription medications. If there is a problem please reply  once you have received a response from your provider. Your safety is important to [place].  If you have drug allergies check your prescription carefully.    You can use  MyChart to ask questions about today's visit, request a non-urgent call back, or ask for a work or school excuse for 24 hours related to this e-Visit. If it has been greater than 24 hours you will need to follow up with your provider, or enter a new e-Visit to address those concerns. You will get an e-mail in the next two days asking about your experience.  I hope that your e-visit has been valuable and will speed your recovery. Thank you for using e-visits.

## 2018-02-04 ENCOUNTER — Encounter: Payer: Self-pay | Admitting: Radiology

## 2018-04-18 ENCOUNTER — Other Ambulatory Visit: Payer: Self-pay | Admitting: Family Medicine

## 2018-04-18 DIAGNOSIS — Z30011 Encounter for initial prescription of contraceptive pills: Secondary | ICD-10-CM

## 2018-08-09 ENCOUNTER — Encounter: Payer: Self-pay | Admitting: Primary Care

## 2018-08-09 ENCOUNTER — Ambulatory Visit (INDEPENDENT_AMBULATORY_CARE_PROVIDER_SITE_OTHER): Payer: No Typology Code available for payment source | Admitting: Primary Care

## 2018-08-09 VITALS — BP 114/66 | HR 65 | Temp 98.6°F | Ht 67.0 in | Wt 178.2 lb

## 2018-08-09 DIAGNOSIS — R229 Localized swelling, mass and lump, unspecified: Secondary | ICD-10-CM | POA: Diagnosis not present

## 2018-08-09 DIAGNOSIS — R2231 Localized swelling, mass and lump, right upper limb: Secondary | ICD-10-CM

## 2018-08-09 NOTE — Addendum Note (Signed)
Addended by: Pleas Koch on: 08/09/2018 09:47 AM   Modules accepted: Orders

## 2018-08-09 NOTE — Progress Notes (Signed)
Subjective:    Patient ID: Jessica Melendez, female    DOB: [birth year], 27 y.o.   MRN: [id number]  HPI  Jessica Melendez is a 27 year old female who presents today with a chief complaint of skin mass.   The mass is located to the right lateral chest wall distal to the axilla that she first noticed 1-2 months ago. Over time the mass has increased in size, some discomfort when laying onto her right side. She denies color changes, breast pain, breast changes, tenderness, redness.   Review of Systems  Genitourinary:       Denies breast pain  Skin: Negative for color change.       Skin mass       No past medical history on file.   Social History   Socioeconomic History  . Marital status: Single    Spouse name: Not on file  . Number of children: Not on file  . Years of education: Not on file  . Highest education level: Not on file  Occupational History  . Not on file  Social Needs  . Financial resource strain: Not on file  . Food insecurity:    Worry: Not on file    Inability: Not on file  . Transportation needs:    Medical: Not on file    Non-medical: Not on file  Tobacco Use  . Smoking status: Never Smoker  . Smokeless tobacco: Never Used  Substance and Sexual Activity  . Alcohol use: Yes    Alcohol/week: 0.0 standard drinks    Comment: socially  . Drug use: No  . Sexual activity: Yes    Partners: Male    Birth control/protection: Condom  Lifestyle  . Physical activity:    Days per week: Not on file    Minutes per session: Not on file  . Stress: Not on file  Relationships  . Social connections:    Talks on phone: Not on file    Gets together: Not on file    Attends religious service: Not on file    Active member of club or organization: Not on file    Attends meetings of clubs or organizations: Not on file    Relationship status: Not on file  . Intimate partner violence:    Fear of current or ex partner: Not on file    Emotionally abused: Not on file   Physically abused: Not on file    Forced sexual activity: Not on file  Other Topics Concern  . Not on file  Social History Narrative   ** Merged History Encounter **       Single. Highest level of education in Biology from [place]. She aspires to be a Marine scientist and then possibly to be a NP. 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     No past surgical history on file.  Family History  Problem Relation Age of Onset  . Diabetes Paternal Grandfather     No Known Allergies  Current Outpatient Medications on File Prior to Visit  Medication Sig Dispense Refill  . fluticasone (FLONASE) 50 MCG/ACT nasal spray Place 2 sprays into both nostrils daily. 16 g 6  . Multiple Vitamins-Minerals (MULTIVITAMIN PO) Take by mouth daily.    . norgestimate-ethinyl estradiol (SPRINTEC 28) 0.25-35 MG-MCG tablet Take 1 tablet by mouth daily. 3 Package 4   No current facility-administered medications on file prior to visit.     BP 114/66   Pulse 65  Temp 98.6 F (37 C) (Oral)   Ht 5\' 7"  (1.702 m)   Wt 178 lb 4 oz (80.9 kg)   LMP 08/07/2018   SpO2 99%   BMI 27.92 kg/m    Objective:   Physical Exam  Constitutional: She appears well-nourished.  Neck: Neck supple.  Cardiovascular: Normal rate and regular rhythm.  Respiratory: Effort normal and breath sounds normal.    Superficial 7 cm circular mass to right lateral chest wall distal to axilla, soft, non tender, flesh colored  Skin: Skin is warm and dry.           Assessment & Plan:

## 2018-08-09 NOTE — Assessment & Plan Note (Signed)
Feels like lipoma/cyst. Given location and rapid growth, will check ultrasound for further evaluation.

## 2018-08-09 NOTE — Patient Instructions (Signed)
Stop by the front desk and speak with either Rosaria Ferries or Anastasiya regarding your ultrasound.  It was a pleasure to see you today!

## 2018-08-11 ENCOUNTER — Ambulatory Visit: Payer: 59 | Admitting: Family Medicine

## 2018-08-12 ENCOUNTER — Telehealth: Payer: Self-pay | Admitting: Primary Care

## 2018-08-12 DIAGNOSIS — Z30011 Encounter for initial prescription of contraceptive pills: Secondary | ICD-10-CM

## 2018-08-12 NOTE — Telephone Encounter (Signed)
Please send refill request to patient's GYN.

## 2018-08-12 NOTE — Telephone Encounter (Signed)
Pt calling and wanting to know the status of her birth control pills being sent to her pharmacy. Pt stated the cma stated they would refill it for her. Triage was looking through pt's chart and saw where pt's OBGYN refill's pt's birth control pills and pt hasn't had a physical here either. Please advise pt. She went to pharmacy and they had not received her prescription.

## 2018-08-12 NOTE — Telephone Encounter (Signed)
Ok to refill? Last refilled by GYN on 04/01/2017 with 4 refills. Last office visit on 08/09/2018

## 2018-08-16 ENCOUNTER — Ambulatory Visit (HOSPITAL_COMMUNITY)
Admission: RE | Admit: 2018-08-16 | Discharge: 2018-08-16 | Disposition: A | Discharge status: Home / Self Care | Payer: No Typology Code available for payment source | Source: Ambulatory Visit | Attending: Primary Care | Admitting: Primary Care

## 2018-08-16 DIAGNOSIS — R2231 Localized swelling, mass and lump, right upper limb: Secondary | ICD-10-CM | POA: Insufficient documentation

## 2018-08-17 ENCOUNTER — Other Ambulatory Visit: Payer: Self-pay | Admitting: Primary Care

## 2018-08-17 DIAGNOSIS — R229 Localized swelling, mass and lump, unspecified: Secondary | ICD-10-CM

## 2018-08-19 MED ORDER — NORGESTIMATE-ETH ESTRADIOL 0.25-35 MG-MCG PO TABS
1.0000 | ORAL_TABLET | Freq: Every day | ORAL | 1 refills | Status: DC
Start: 2018-08-19 — End: 2019-01-04

## 2018-08-23 ENCOUNTER — Ambulatory Visit
Admission: RE | Admit: 2018-08-23 | Discharge: 2018-08-23 | Disposition: A | Discharge status: Home / Self Care | Payer: No Typology Code available for payment source | Source: Ambulatory Visit | Attending: Primary Care | Admitting: Primary Care

## 2018-08-23 DIAGNOSIS — R229 Localized swelling, mass and lump, unspecified: Secondary | ICD-10-CM | POA: Diagnosis not present

## 2018-08-25 ENCOUNTER — Other Ambulatory Visit: Payer: Self-pay | Admitting: Primary Care

## 2018-08-25 DIAGNOSIS — D171 Benign lipomatous neoplasm of skin and subcutaneous tissue of trunk: Secondary | ICD-10-CM

## 2018-09-13 ENCOUNTER — Other Ambulatory Visit: Payer: Self-pay

## 2018-09-13 ENCOUNTER — Ambulatory Visit (INDEPENDENT_AMBULATORY_CARE_PROVIDER_SITE_OTHER): Payer: No Typology Code available for payment source | Admitting: Surgery

## 2018-09-13 ENCOUNTER — Encounter: Payer: Self-pay | Admitting: Surgery

## 2018-09-13 ENCOUNTER — Encounter: Payer: Self-pay | Admitting: *Deleted

## 2018-09-13 VITALS — BP 120/68 | HR 82 | Temp 97.9°F | Ht 67.0 in | Wt 178.0 lb

## 2018-09-13 DIAGNOSIS — R2231 Localized swelling, mass and lump, right upper limb: Secondary | ICD-10-CM | POA: Diagnosis not present

## 2018-09-13 NOTE — Progress Notes (Signed)
Patient's surgery to be scheduled for 10-07-18 at [facility] with Dr. Dahlia Byes.  The patient is aware she will be contacted by the [place] to complete a phone interview sometime in the near future.  The patient is aware to call the office should she have further questions.

## 2018-09-13 NOTE — H&P (View-Only) (Signed)
Patient ID: Jessica Melendez, female   DOB: Mar 31, 1991, 28 y.o.   MRN: [id number]  HPI Omar Orrego is a 28 y.o. female seen in consultation at the request of Mrs. Carlis Abbott, NP.  Patient reports gradual increase in size of right axillary mass.  She reports some discomfort and it is uncomfortable while wearing her bra.  No fevers no chills no evidence of B type symptoms.  No weight loss.  She denies any pain but there is some discomfort and also reports that over the last few months has increased in size.  No family history of sarcomas or lymphomas.  She did have work-up including a CT as well as an ultrasound that I have both both personally reviewed showing evidence of a soft tissue mass likely a lipoma.  No evidence of malignant features She Is otherwise healthy and able to perform more than 6 METS of activity without any shortness of breath or chest pain HPI  History reviewed. No pertinent past medical history.  History reviewed. No pertinent surgical history.  Family History  Problem Relation Age of Onset  . Diabetes Paternal Grandfather     Social History Social History   Tobacco Use  . Smoking status: Never Smoker  . Smokeless tobacco: Never Used  Substance Use Topics  . Alcohol use: Yes    Alcohol/week: 0.0 standard drinks    Comment: socially  . Drug use: No    No Known Allergies  Current Outpatient Medications  Medication Sig Dispense Refill  . fluticasone (FLONASE) 50 MCG/ACT nasal spray Place 2 sprays into both nostrils daily. 16 g 6  . Multiple Vitamins-Minerals (MULTIVITAMIN PO) Take by mouth daily.    . norgestimate-ethinyl estradiol (ORTHO-CYCLEN,SPRINTEC,PREVIFEM) 0.25-35 MG-MCG tablet Take 1 tablet by mouth daily. 1 Package 1   No current facility-administered medications for this visit.      Review of Systems Full ROS  was asked and was negative except for the information on the HPI  Physical Exam Blood pressure 120/68, pulse 82, temperature 97.9 F (36.6 C),  temperature source Skin, height 5\' 7"  (1.702 m), weight 178 lb (80.7 kg), SpO2 99 %. CONSTITUTIONAL:NAD EYES: Pupils are equal, round, and reactive to light, Sclera are non-icteric. EARS, NOSE, MOUTH AND THROAT: The oropharynx is clear. The oral mucosa is pink and moist. Hearing is intact to voice. BREAST: normal skin and nipple. No masses AXILLA: Dense of an 11 cm soft tissue mass posterior right axilla.  It is soft and mobile.  Consistent with a lipoma LYMPH NODES:  Lymph nodes in the neck are normal. RESPIRATORY:  Lungs are clear. There is normal respiratory effort, with equal breath sounds bilaterally, and without pathologic use of accessory muscles. CARDIOVASCULAR: Heart is regular without murmurs, gallops, or rubs. GI: The abdomen is soft, nontender, and nondistended. There are no palpable masses. There is no hepatosplenomegaly. There are normal bowel sounds in all quadrants. GU: Rectal deferred.   MUSCULOSKELETAL: Normal muscle strength and tone. No cyanosis or edema.   SKIN: Turgor is good and there are no pathologic skin lesions or ulcers. NEUROLOGIC: Motor and sensation is grossly normal. Cranial nerves are grossly intact. PSYCH:  Oriented to person, place and time. Affect is normal.  Data Reviewed  I have personally reviewed the patient's imaging, laboratory findings and medical records.    Assessment/Plan Large Right axillary mass c/w lipoma.  Given the size and the relatively increase in size over the last few months I do recommend excision of this mass. Gust with her  that she will need to be done in the hospital and the general anesthetic since is a pretty significant size and we may encounter some bleeding.  Procedure discussed with the patient in detail.  Risk benefit and possible applications including but not limited to: Bleeding, infection, seroma formation and recurrence.  She understands and wishes to proceed. This report was sent to the referring provider  Caroleen Hamman,  MD FACS General Surgeon 09/13/2018, 10:57 AM

## 2018-09-13 NOTE — Progress Notes (Signed)
Patient ID: Jessica Melendez, female   DOB: 08-15-1991, 28 y.o.   MRN: [id number]  HPI Jessica Melendez is a 28 y.o. female seen in consultation at the request of Mrs. Carlis Abbott, NP.  Patient reports gradual increase in size of right axillary mass.  She reports some discomfort and it is uncomfortable while wearing her bra.  No fevers no chills no evidence of B type symptoms.  No weight loss.  She denies any pain but there is some discomfort and also reports that over the last few months has increased in size.  No family history of sarcomas or lymphomas.  She did have work-up including a CT as well as an ultrasound that I have both both personally reviewed showing evidence of a soft tissue mass likely a lipoma.  No evidence of malignant features She Is otherwise healthy and able to perform more than 6 METS of activity without any shortness of breath or chest pain HPI  History reviewed. No pertinent past medical history.  History reviewed. No pertinent surgical history.  Family History  Problem Relation Age of Onset  . Diabetes Paternal Grandfather     Social History Social History   Tobacco Use  . Smoking status: Never Smoker  . Smokeless tobacco: Never Used  Substance Use Topics  . Alcohol use: Yes    Alcohol/week: 0.0 standard drinks    Comment: socially  . Drug use: No    No Known Allergies  Current Outpatient Medications  Medication Sig Dispense Refill  . fluticasone (FLONASE) 50 MCG/ACT nasal spray Place 2 sprays into both nostrils daily. 16 g 6  . Multiple Vitamins-Minerals (MULTIVITAMIN PO) Take by mouth daily.    . norgestimate-ethinyl estradiol (ORTHO-CYCLEN,SPRINTEC,PREVIFEM) 0.25-35 MG-MCG tablet Take 1 tablet by mouth daily. 1 Package 1   No current facility-administered medications for this visit.      Review of Systems Full ROS  was asked and was negative except for the information on the HPI  Physical Exam Blood pressure 120/68, pulse 82, temperature 97.9 F (36.6 C),  temperature source Skin, height 5\' 7"  (1.702 m), weight 178 lb (80.7 kg), SpO2 99 %. CONSTITUTIONAL:NAD EYES: Pupils are equal, round, and reactive to light, Sclera are non-icteric. EARS, NOSE, MOUTH AND THROAT: The oropharynx is clear. The oral mucosa is pink and moist. Hearing is intact to voice. BREAST: normal skin and nipple. No masses AXILLA: Dense of an 11 cm soft tissue mass posterior right axilla.  It is soft and mobile.  Consistent with a lipoma LYMPH NODES:  Lymph nodes in the neck are normal. RESPIRATORY:  Lungs are clear. There is normal respiratory effort, with equal breath sounds bilaterally, and without pathologic use of accessory muscles. CARDIOVASCULAR: Heart is regular without murmurs, gallops, or rubs. GI: The abdomen is soft, nontender, and nondistended. There are no palpable masses. There is no hepatosplenomegaly. There are normal bowel sounds in all quadrants. GU: Rectal deferred.   MUSCULOSKELETAL: Normal muscle strength and tone. No cyanosis or edema.   SKIN: Turgor is good and there are no pathologic skin lesions or ulcers. NEUROLOGIC: Motor and sensation is grossly normal. Cranial nerves are grossly intact. PSYCH:  Oriented to person, place and time. Affect is normal.  Data Reviewed  I have personally reviewed the patient's imaging, laboratory findings and medical records.    Assessment/Plan Large Right axillary mass c/w lipoma.  Given the size and the relatively increase in size over the last few months I do recommend excision of this mass. Gust with her  that she will need to be done in the hospital and the general anesthetic since is a pretty significant size and we may encounter some bleeding.  Procedure discussed with the patient in detail.  Risk benefit and possible applications including but not limited to: Bleeding, infection, seroma formation and recurrence.  She understands and wishes to proceed. This report was sent to the referring provider  Caroleen Hamman,  MD FACS General Surgeon 09/13/2018, 10:57 AM

## 2018-09-13 NOTE — Patient Instructions (Addendum)
Patient is to be scheduled for surgery with Dr.Pabon.   Call the office with any questions or concerns. 

## 2018-09-23 ENCOUNTER — Inpatient Hospital Stay: Admission: RE | Admit: 2018-09-23 | Payer: No Typology Code available for payment source | Source: Ambulatory Visit

## 2018-09-24 ENCOUNTER — Other Ambulatory Visit: Payer: Self-pay

## 2018-09-24 ENCOUNTER — Encounter
Admission: RE | Admit: 2018-09-24 | Discharge: 2018-09-24 | Disposition: A | Discharge status: Home / Self Care | Payer: No Typology Code available for payment source | Source: Ambulatory Visit | Attending: Surgery | Admitting: Surgery

## 2018-09-24 NOTE — Patient Instructions (Signed)
Your procedure is scheduled on: 10-07-18 Report to Same Day Surgery 2nd floor medical mall [facility] Entrance-take elevator on left to 2nd floor.  Check in with surgery information desk.) To find out your arrival time please call [phone] between 1PM - 3PM on 10-06-18  Remember: Instructions that are not followed completely may result in serious medical risk, up to and including death, or upon the discretion of your surgeon and anesthesiologist your surgery may need to be rescheduled.    _x___ 1. Do not eat food after midnight the night before your procedure. You may drink clear liquids up to 2 hours before you are scheduled to arrive at the hospital for your procedure.  Do not drink clear liquids within 2 hours of your scheduled arrival to the hospital.  Clear liquids include  --Water or Apple juice without pulp  --Clear carbohydrate beverage such as ClearFast or Gatorade  --Black Coffee or Clear Tea (No milk, no creamers, do not add anything to the coffee or Tea   ____Ensure clear carbohydrate drink on the way to the hospital for bariatric patients  ____Ensure clear carbohydrate drink 3 hours before surgery for Dr Dwyane Luo patients if physician instructed.   No gum chewing or hard candies.     __x__ 2. No Alcohol for 24 hours before or after surgery.   __x__3. No Smoking or e-cigarettes for 24 prior to surgery.  Do not use any chewable tobacco products for at least 6 hour prior to surgery   ____  4. Bring all medications with you on the day of surgery if instructed.    __x__ 5. Notify your doctor if there is any change in your medical condition     (cold, fever, infections).    x___6. On the morning of surgery brush your teeth with toothpaste and water.  You may rinse your mouth with mouth wash if you wish.  Do not swallow any toothpaste or mouthwash.   Do not wear jewelry, make-up, hairpins, clips or nail polish.  Do not wear lotions, powders, or perfumes. You may wear  deodorant.  Do not shave 48 hours prior to surgery. Men may shave face and neck.  Do not bring valuables to the hospital.    [facility] is not responsible for any belongings or valuables.               Contacts, dentures or bridgework may not be worn into surgery.  Leave your suitcase in the car. After surgery it may be brought to your room.  For patients admitted to the hospital, discharge time is determined by your  treatment team.  _  Patients discharged the day of surgery will not be allowed to drive home.  You will need someone to drive you home and stay with you the night of your procedure.    Please read over the following fact sheets that you were given:   [facility] Preparing for Surgery  ____ Take anti-hypertensive listed below, cardiac, seizure, asthma, anti-reflux and psychiatric medicines. These include:  1. NONE  2.  3.  4.  5.  6.  ____Fleets enema or Magnesium Citrate as directed.   ____ Use CHG Soap or sage wipes as directed on instruction sheet   ____ Use inhalers on the day of surgery and bring to hospital day of surgery  ____ Stop Metformin and Janumet 2 days prior to surgery.    ____ Take 1/2 of usual insulin dose the night before surgery and none on  the morning surgery.   ____ Follow recommendations from Cardiologist, Pulmonologist or PCP regarding stopping Aspirin, Coumadin, Plavix ,Eliquis, Effient, or Pradaxa, and Pletal.  X____Stop Anti-inflammatories such as Advil, Aleve, Ibuprofen, Motrin, Naproxen, Naprosyn, Goodies powders or aspirin products 7 DAYS PRIOR TO SURGERY-OK to take Tylenol    ____ Stop supplements until after surgery.   ____ Bring C-Pap to the hospital.

## 2018-10-06 MED ORDER — CEFAZOLIN SODIUM-DEXTROSE 2-4 GM/100ML-% IV SOLN
2.0000 g | INTRAVENOUS | Status: AC
  Administered 2018-10-07: 2 g via INTRAVENOUS

## 2018-10-07 ENCOUNTER — Ambulatory Visit
Admission: RE | Admit: 2018-10-07 | Discharge: 2018-10-07 | Disposition: A | Discharge status: Home / Self Care | Payer: No Typology Code available for payment source | Source: Ambulatory Visit | Attending: Surgery | Admitting: Surgery

## 2018-10-07 ENCOUNTER — Ambulatory Visit: Payer: No Typology Code available for payment source | Admitting: Certified Registered"

## 2018-10-07 ENCOUNTER — Encounter
Admission: RE | Disposition: A | Discharge status: Home / Self Care | Payer: Self-pay | Source: Ambulatory Visit | Attending: Surgery

## 2018-10-07 ENCOUNTER — Other Ambulatory Visit: Payer: Self-pay

## 2018-10-07 ENCOUNTER — Encounter: Payer: Self-pay | Admitting: *Deleted

## 2018-10-07 DIAGNOSIS — Z79899 Other long term (current) drug therapy: Secondary | ICD-10-CM | POA: Diagnosis not present

## 2018-10-07 DIAGNOSIS — D1779 Benign lipomatous neoplasm of other sites: Secondary | ICD-10-CM | POA: Insufficient documentation

## 2018-10-07 DIAGNOSIS — R222 Localized swelling, mass and lump, trunk: Secondary | ICD-10-CM | POA: Diagnosis present

## 2018-10-07 DIAGNOSIS — R2231 Localized swelling, mass and lump, right upper limb: Secondary | ICD-10-CM

## 2018-10-07 DIAGNOSIS — Z7951 Long term (current) use of inhaled steroids: Secondary | ICD-10-CM | POA: Diagnosis not present

## 2018-10-07 DIAGNOSIS — Z793 Long term (current) use of hormonal contraceptives: Secondary | ICD-10-CM | POA: Diagnosis not present

## 2018-10-07 HISTORY — PX: MASS EXCISION: SHX2000

## 2018-10-07 LAB — POCT PREGNANCY, URINE: Preg Test, Ur: NEGATIVE

## 2018-10-07 SURGERY — EXCISION MASS
Anesthesia: General | Laterality: Right

## 2018-10-07 MED ORDER — DEXAMETHASONE SODIUM PHOSPHATE 10 MG/ML IJ SOLN
INTRAMUSCULAR | Status: DC | PRN
  Administered 2018-10-07: 10 mg via INTRAVENOUS

## 2018-10-07 MED ORDER — EPINEPHRINE PF 1 MG/ML IJ SOLN
INTRAMUSCULAR | Status: AC
  Filled 2018-10-07: qty 1

## 2018-10-07 MED ORDER — PROPOFOL 10 MG/ML IV BOLUS
INTRAVENOUS | Status: AC
  Filled 2018-10-07: qty 20

## 2018-10-07 MED ORDER — FENTANYL CITRATE (PF) 100 MCG/2ML IJ SOLN
INTRAMUSCULAR | Status: DC | PRN
  Administered 2018-10-07: 50 ug via INTRAVENOUS

## 2018-10-07 MED ORDER — BUPIVACAINE HCL 0.25 % IJ SOLN
INTRAMUSCULAR | Status: DC | PRN
  Administered 2018-10-07: 30 mL

## 2018-10-07 MED ORDER — GABAPENTIN 300 MG PO CAPS
300.0000 mg | ORAL_CAPSULE | ORAL | Status: AC
  Administered 2018-10-07: 300 mg via ORAL

## 2018-10-07 MED ORDER — ACETAMINOPHEN 500 MG PO TABS
1000.0000 mg | ORAL_TABLET | ORAL | Status: AC
  Administered 2018-10-07: 1000 mg via ORAL

## 2018-10-07 MED ORDER — FENTANYL CITRATE (PF) 100 MCG/2ML IJ SOLN: 25.0000 ug | INTRAMUSCULAR | Status: DC | PRN

## 2018-10-07 MED ORDER — FENTANYL CITRATE (PF) 100 MCG/2ML IJ SOLN
INTRAMUSCULAR | Status: AC
  Filled 2018-10-07: qty 2

## 2018-10-07 MED ORDER — DEXAMETHASONE SODIUM PHOSPHATE 10 MG/ML IJ SOLN
INTRAMUSCULAR | Status: AC
  Filled 2018-10-07: qty 1

## 2018-10-07 MED ORDER — BUPIVACAINE HCL (PF) 0.25 % IJ SOLN
INTRAMUSCULAR | Status: AC
  Filled 2018-10-07: qty 30

## 2018-10-07 MED ORDER — CELECOXIB 200 MG PO CAPS
200.0000 mg | ORAL_CAPSULE | ORAL | Status: AC
  Administered 2018-10-07: 200 mg via ORAL

## 2018-10-07 MED ORDER — PROPOFOL 10 MG/ML IV BOLUS
INTRAVENOUS | Status: DC | PRN
  Administered 2018-10-07: 150 mg via INTRAVENOUS

## 2018-10-07 MED ORDER — CELECOXIB 200 MG PO CAPS
ORAL_CAPSULE | ORAL | Status: AC
  Administered 2018-10-07: 200 mg via ORAL
  Filled 2018-10-07: qty 1

## 2018-10-07 MED ORDER — ROCURONIUM BROMIDE 100 MG/10ML IV SOLN
INTRAVENOUS | Status: DC | PRN
  Administered 2018-10-07: 40 mg via INTRAVENOUS

## 2018-10-07 MED ORDER — FAMOTIDINE 20 MG PO TABS
ORAL_TABLET | ORAL | Status: AC
  Administered 2018-10-07: 20 mg via ORAL
  Filled 2018-10-07: qty 1

## 2018-10-07 MED ORDER — SUGAMMADEX SODIUM 200 MG/2ML IV SOLN
INTRAVENOUS | Status: DC | PRN
  Administered 2018-10-07: 200 mg via INTRAVENOUS

## 2018-10-07 MED ORDER — FAMOTIDINE 20 MG PO TABS
20.0000 mg | ORAL_TABLET | Freq: Once | ORAL | Status: AC
  Administered 2018-10-07: 20 mg via ORAL

## 2018-10-07 MED ORDER — ACETAMINOPHEN 500 MG PO TABS
ORAL_TABLET | ORAL | Status: AC
  Administered 2018-10-07: 1000 mg via ORAL
  Filled 2018-10-07: qty 2

## 2018-10-07 MED ORDER — GABAPENTIN 300 MG PO CAPS
ORAL_CAPSULE | ORAL | Status: AC
  Administered 2018-10-07: 300 mg via ORAL
  Filled 2018-10-07: qty 1

## 2018-10-07 MED ORDER — ONDANSETRON HCL 4 MG/2ML IJ SOLN
INTRAMUSCULAR | Status: AC
  Filled 2018-10-07: qty 2

## 2018-10-07 MED ORDER — CHLORHEXIDINE GLUCONATE CLOTH 2 % EX PADS: 6.0000 | MEDICATED_PAD | Freq: Once | CUTANEOUS | Status: DC

## 2018-10-07 MED ORDER — MIDAZOLAM HCL 2 MG/2ML IJ SOLN
INTRAMUSCULAR | Status: AC
  Filled 2018-10-07: qty 2

## 2018-10-07 MED ORDER — CEFAZOLIN SODIUM-DEXTROSE 2-4 GM/100ML-% IV SOLN
INTRAVENOUS | Status: AC
  Filled 2018-10-07: qty 100

## 2018-10-07 MED ORDER — LIDOCAINE HCL (PF) 2 % IJ SOLN
INTRAMUSCULAR | Status: AC
  Filled 2018-10-07: qty 10

## 2018-10-07 MED ORDER — HYDROCODONE-ACETAMINOPHEN 5-325 MG PO TABS: 1.0000 | ORAL_TABLET | Freq: Four times a day (QID) | ORAL | 0 refills | Status: DC | PRN

## 2018-10-07 MED ORDER — LIDOCAINE HCL (CARDIAC) PF 100 MG/5ML IV SOSY
PREFILLED_SYRINGE | INTRAVENOUS | Status: DC | PRN
  Administered 2018-10-07: 60 mg via INTRAVENOUS

## 2018-10-07 MED ORDER — MIDAZOLAM HCL 2 MG/2ML IJ SOLN
INTRAMUSCULAR | Status: DC | PRN
  Administered 2018-10-07: 2 mg via INTRAVENOUS

## 2018-10-07 MED ORDER — LACTATED RINGERS IV SOLN
INTRAVENOUS | Status: DC
  Administered 2018-10-07: 08:00:00 via INTRAVENOUS

## 2018-10-07 MED ORDER — ROCURONIUM BROMIDE 50 MG/5ML IV SOLN
INTRAVENOUS | Status: AC
  Filled 2018-10-07: qty 1

## 2018-10-07 MED ORDER — ONDANSETRON HCL 4 MG/2ML IJ SOLN: 4.0000 mg | Freq: Once | INTRAMUSCULAR | Status: DC | PRN

## 2018-10-07 SURGICAL SUPPLY — 24 items
BLADE SURG 15 STRL LF DISP TIS (BLADE) ×1 IMPLANT
BLADE SURG 15 STRL SS (BLADE) ×2
CANISTER SUCT 1200ML W/VALVE (MISCELLANEOUS) ×3 IMPLANT
CHLORAPREP W/TINT 26ML (MISCELLANEOUS) ×3 IMPLANT
COVER WAND RF STERILE (DRAPES) ×3 IMPLANT
DERMABOND ADVANCED (GAUZE/BANDAGES/DRESSINGS) ×2
DERMABOND ADVANCED .7 DNX12 (GAUZE/BANDAGES/DRESSINGS) ×1 IMPLANT
DRAPE LAPAROTOMY 100X77 ABD (DRAPES) ×3 IMPLANT
ELECT REM PT RETURN 9FT ADLT (ELECTROSURGICAL) ×3
ELECTRODE REM PT RTRN 9FT ADLT (ELECTROSURGICAL) ×1 IMPLANT
GLOVE BIO SURGEON STRL SZ7 (GLOVE) ×3 IMPLANT
GOWN STRL REUS W/ TWL LRG LVL3 (GOWN DISPOSABLE) ×2 IMPLANT
GOWN STRL REUS W/TWL LRG LVL3 (GOWN DISPOSABLE) ×4
LABEL OR SOLS (LABEL) ×3 IMPLANT
NEEDLE HYPO 22GX1.5 SAFETY (NEEDLE) ×3 IMPLANT
NS IRRIG 500ML POUR BTL (IV SOLUTION) ×3 IMPLANT
PACK BASIN MINOR ARMC (MISCELLANEOUS) ×3 IMPLANT
SPONGE LAP 18X18 RF (DISPOSABLE) ×3 IMPLANT
SUT MNCRL 4-0 (SUTURE) ×2
SUT MNCRL 4-0 27XMFL (SUTURE) ×1
SUT VIC AB 0 CT1 36 (SUTURE) ×3 IMPLANT
SUT VIC AB 2-0 CT2 27 (SUTURE) ×3 IMPLANT
SUTURE MNCRL 4-0 27XMF (SUTURE) ×1 IMPLANT
SYR 10ML LL (SYRINGE) ×3 IMPLANT

## 2018-10-07 NOTE — Transfer of Care (Signed)
Immediate Anesthesia Transfer of Care Note  Patient: Jessica Melendez  Procedure(s) Performed: EXCISION AXILLARY MASS (Right )  Patient Location: PACU  Anesthesia Type:General  Level of Consciousness: awake and alert   Airway & Oxygen Therapy: Patient Spontanous Breathing and Patient connected to face mask oxygen  Post-op Assessment: Report given to RN and Post -op Vital signs reviewed and stable  Post vital signs: Reviewed and stable  Last Vitals:  Vitals Value Taken Time  BP 123/86 10/07/2018 10:37 AM  Temp    Pulse 67 10/07/2018 10:38 AM  Resp 22 10/07/2018 10:38 AM  SpO2 100 % 10/07/2018 10:38 AM  Vitals shown include unvalidated device data.  Last Pain:  Vitals:   10/07/18 1022  TempSrc:   PainSc: 0-No pain         Complications: No apparent anesthesia complications

## 2018-10-07 NOTE — Anesthesia Postprocedure Evaluation (Signed)
Anesthesia Post Note  Patient: Ave Filter  Procedure(s) Performed: EXCISION AXILLARY MASS (Right )  Patient location during evaluation: PACU Anesthesia Type: General Level of consciousness: awake and alert Pain management: pain level controlled Vital Signs Assessment: post-procedure vital signs reviewed and stable Respiratory status: spontaneous breathing, nonlabored ventilation, respiratory function stable and patient connected to nasal cannula oxygen Cardiovascular status: blood pressure returned to baseline and stable Postop Assessment: no apparent nausea or vomiting Anesthetic complications: no     Last Vitals:  Vitals:   10/07/18 1129 10/07/18 1213  BP: 116/70 117/73  Pulse: (!) 53 63  Resp: 16 16  Temp: 36.6 C   SpO2: 100% 100%    Last Pain:  Vitals:   10/07/18 1213  TempSrc: Oral  PainSc: 0-No pain                 Molli Barrows

## 2018-10-07 NOTE — Anesthesia Procedure Notes (Signed)
Procedure Name: Intubation Date/Time: 10/07/2018 9:09 AM Performed by: Philbert Riser, CRNA Pre-anesthesia Checklist: Patient identified, Suction available, Emergency Drugs available, Patient being monitored and Timeout performed Patient Re-evaluated:Patient Re-evaluated prior to induction Oxygen Delivery Method: Circle system utilized and Simple face mask Preoxygenation: Pre-oxygenation with 100% oxygen Induction Type: IV induction Ventilation: Mask ventilation without difficulty Laryngoscope Size: Mac and 3 Grade View: Grade I Tube type: Oral Tube size: 7.0 mm Number of attempts: 1 Airway Equipment and Method: Stylet Placement Confirmation: ETT inserted through vocal cords under direct vision,  positive ETCO2 and breath sounds checked- equal and bilateral Secured at: 22 cm Tube secured with: Tape Dental Injury: Teeth and Oropharynx as per pre-operative assessment

## 2018-10-07 NOTE — Op Note (Addendum)
  Pre-operative Diagnosis: Right Axillary mass   Post-operative Diagnosis: Same   Surgeon: Caroleen Hamman, MD FACS  Anesthesia: GETA  Procedure: Excision of Right Deep axillary mass measuring 13x 8 cms   Findings: Large lipoma, Deep to the fascia and on top of the latissimus dorsi  Estimated Blood Loss: Minimal         Drains: None         Specimens: lipoma   Complications: none                  Condition: Stable    Procedure Details  The patient was seen again in the Holding Room. The benefits, complications, treatment options, and expected outcomes were discussed with the patient. The risks of bleeding, infection, recurrence of symptoms, failure to resolve symptoms, hematoma, seroma, open wound, cosmetic deformity, and the need for further surgery were discussed.  The patient was taken to Operating Room, identified as Jessica Melendez and the procedure verified.  A Time Out was held and the above information confirmed.  Prior to the induction of general anesthesia, antibiotic prophylaxis was administered. VTE prophylaxis was in place. Appropriate anesthesia was then administered and tolerated well. The chest was prepped with Chloraprep and draped in the sterile fashion. The patient was positioned in the lateral decubitus position with all pressure points well padded and an axillary roll.  Incision was created with a 15 blade knife and electrocautery was used to dissect through subcutaneous tissue.  We incised the fascia fascia the lipoma was found to be right on top of the latissimus dorsi muscle.  Using electrocautery we dissected circumferentially to have adequate margins and perform a good excision.  HemoStasis was obtained with electrocautery. We irrigated the wound normal saline. The wound was closed in a 3 layer fashion with multiple interrupted 0 Vicryl to incorporate the fascia 2-0 Vicryl for the dermis and 4-0 Monocryl for the skin.  Dermabond was applied.   Patient was  taken to the recovery room in stable condition      Caroleen Hamman, MD, FACS

## 2018-10-07 NOTE — Interval H&P Note (Signed)
History and Physical Interval Note:  10/07/2018 8:16 AM  Jessica Melendez  has presented today for surgery, with the diagnosis of AXILLARY MASS  The various methods of treatment have been discussed with the patient and family. After consideration of risks, benefits and other options for treatment, the patient has consented to  Procedure(s): EXCISION AXILLARY MASS (Right) as a surgical intervention .  The patient's history has been reviewed, patient examined, no change in status, stable for surgery.  I have reviewed the patient's chart and labs.  Questions were answered to the patient's satisfaction.     [place]

## 2018-10-07 NOTE — Discharge Instructions (Addendum)

## 2018-10-07 NOTE — Anesthesia Post-op Follow-up Note (Signed)
Anesthesia QCDR form completed.        

## 2018-10-07 NOTE — Anesthesia Preprocedure Evaluation (Signed)
Anesthesia Evaluation  Patient identified by MRN, date of birth, ID band Patient awake    Reviewed: Allergy & Precautions, H&P , NPO status , Patient's Chart, lab work & pertinent test results, reviewed documented beta blocker date and time   Airway Mallampati: II  TM Distance: >3 FB Neck ROM: full    Dental  (+) Teeth Intact   Pulmonary neg pulmonary ROS,    Pulmonary exam normal        Cardiovascular negative cardio ROS Normal cardiovascular exam Rhythm:regular Rate:Normal     Neuro/Psych negative neurological ROS  negative psych ROS   GI/Hepatic negative GI ROS, Neg liver ROS,   Endo/Other  negative endocrine ROS  Renal/GU negative Renal ROS  negative genitourinary   Musculoskeletal   Abdominal   Peds  Hematology negative hematology ROS (+)   Anesthesia Other Findings History reviewed. No pertinent past medical history. Past Surgical History: No date: NO PAST SURGERIES BMI    Body Mass Index:  28.04 kg/m     Reproductive/Obstetrics negative OB ROS                             Anesthesia Physical Anesthesia Plan  ASA: I  Anesthesia Plan: General ETT   Post-op Pain Management:    Induction:   PONV Risk Score and Plan:   Airway Management Planned:   Additional Equipment:   Intra-op Plan:   Post-operative Plan:   Informed Consent: I have reviewed the patients History and Physical, chart, labs and discussed the procedure including the risks, benefits and alternatives for the proposed anesthesia with the patient or authorized representative who has indicated his/her understanding and acceptance.     Dental Advisory Given  Plan Discussed with: CRNA  Anesthesia Plan Comments:         Anesthesia Quick Evaluation

## 2018-10-08 LAB — SURGICAL PATHOLOGY

## 2018-10-20 ENCOUNTER — Other Ambulatory Visit: Payer: Self-pay

## 2018-10-20 ENCOUNTER — Encounter: Payer: Self-pay | Admitting: Surgery

## 2018-10-20 ENCOUNTER — Ambulatory Visit (INDEPENDENT_AMBULATORY_CARE_PROVIDER_SITE_OTHER): Payer: No Typology Code available for payment source | Admitting: Surgery

## 2018-10-20 VITALS — BP 112/76 | HR 57 | Temp 97.7°F | Resp 14 | Ht 67.0 in | Wt 178.0 lb

## 2018-10-20 DIAGNOSIS — Z09 Encounter for follow-up examination after completed treatment for conditions other than malignant neoplasm: Secondary | ICD-10-CM

## 2018-10-20 NOTE — Progress Notes (Signed)
S/p Right axillary lipoma excision Doing very well No complaints Path d/w pt No fevers or chills  PE NAD Incisions healing well, no infection, seroma or hematoma.  A/p Doing very well No complications RTC prn

## 2018-10-20 NOTE — Patient Instructions (Signed)
Patient will need to return to the office as needed.    Call the office with any questions or concerns. 

## 2019-01-04 ENCOUNTER — Other Ambulatory Visit: Payer: Self-pay | Admitting: *Deleted

## 2019-01-04 DIAGNOSIS — Z30011 Encounter for initial prescription of contraceptive pills: Secondary | ICD-10-CM

## 2019-01-04 MED ORDER — NORGESTIMATE-ETH ESTRADIOL 0.25-35 MG-MCG PO TABS: 1.0000 | ORAL_TABLET | Freq: Every day | ORAL | 3 refills | Status: DC

## 2019-01-04 NOTE — Telephone Encounter (Signed)
Pt requesting refill of OCP's and was unsure if she needed to be seen. Explained that due to [place] we are refilling OCP's if pt is not having any issues and we will get her in for her annual when we are able too.

## 2019-12-12 DIAGNOSIS — E669 Obesity, unspecified: Secondary | ICD-10-CM | POA: Insufficient documentation

## 2020-05-01 IMAGING — CT CT CHEST W/O CM
2 of 4 series · 15 of 36 positions shown, 18 images · non-contrast
Comparison: None.

CLINICAL DATA: Palpable lump laterally in the right lateral chest
wall and axilla. Included nodes reported a 7 cm circumferential
mass.

EXAM:
CT CHEST WITHOUT CONTRAST
TECHNIQUE: Multidetector CT imaging of the chest was performed following the
standard protocol without IV contrast.

[Series 4: thorax 2.0 · axial · 0.71mm/px · z∈[+1050,+1294]mm · 12 of 146 slices shown, 15 images]
[im 12/146  mediastinal]
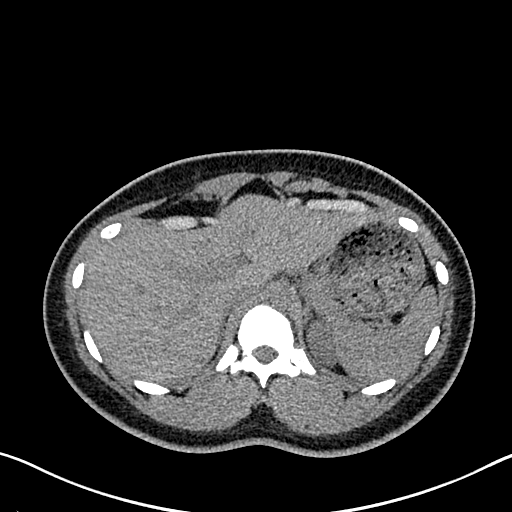
[im 12/146  lung]
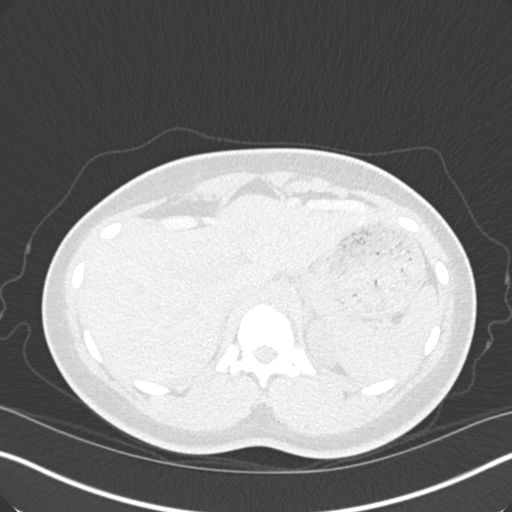
[im 23/146  lung]
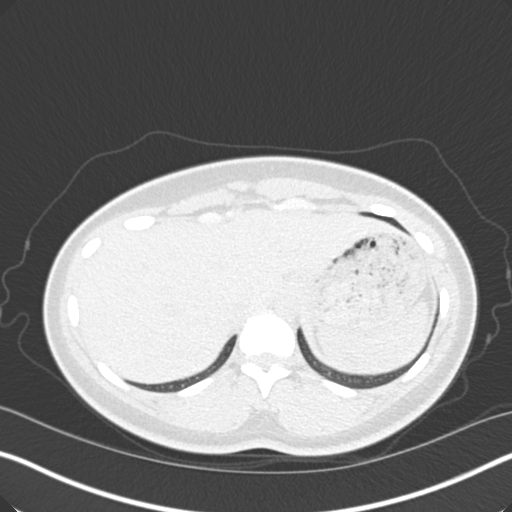
[im 34/146  lung]
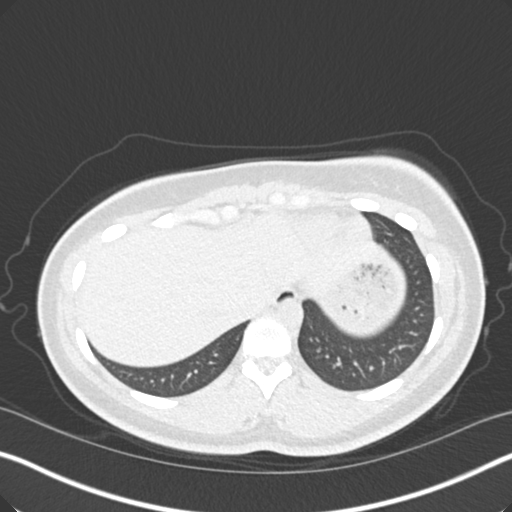
[im 45/146  lung]
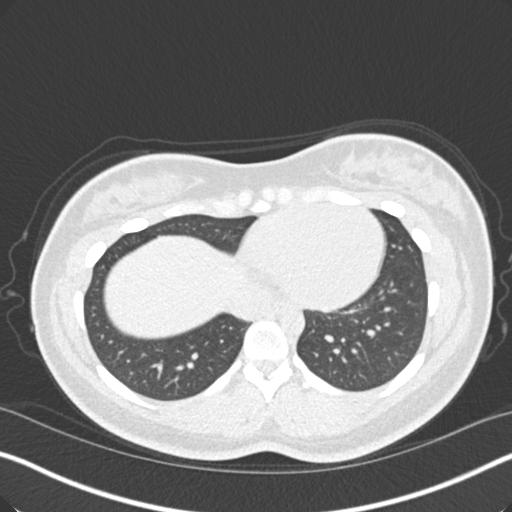
[im 56/146  mediastinal]
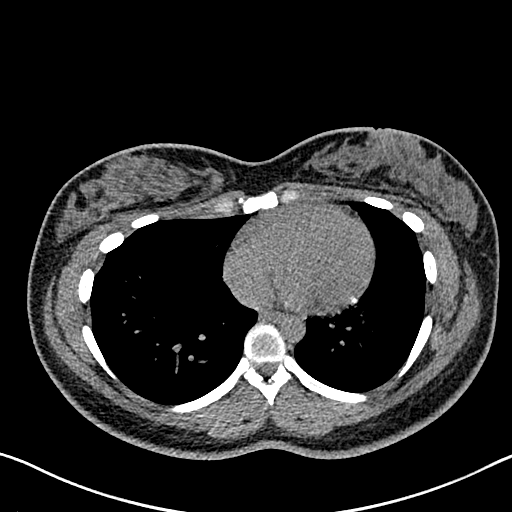
[im 56/146  lung]
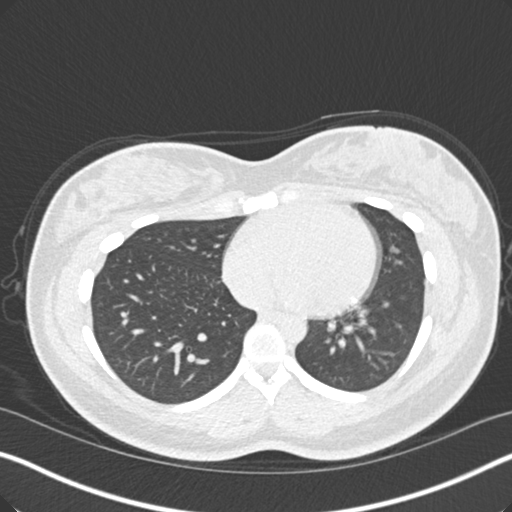
[im 67/146  lung]
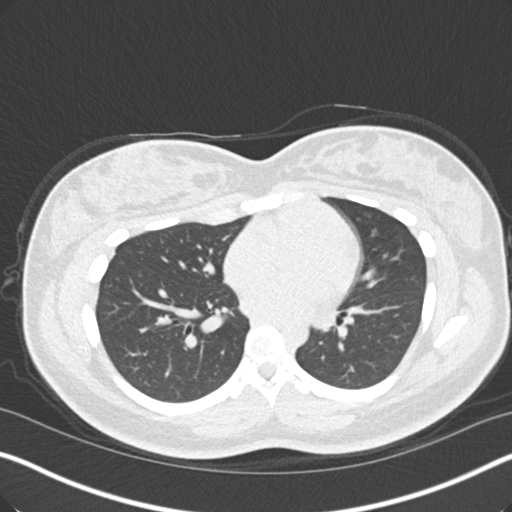
[im 79/146  lung]
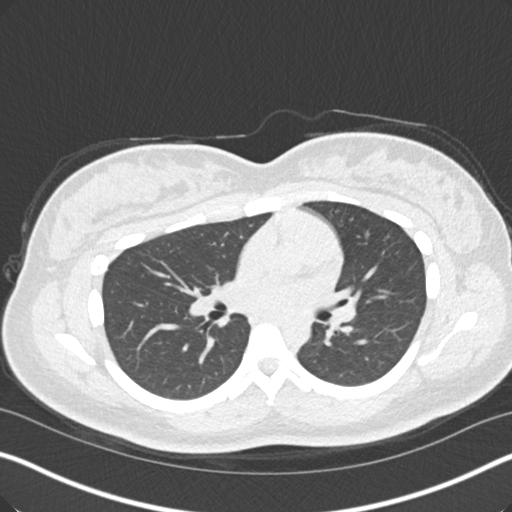
[im 90/146  lung]
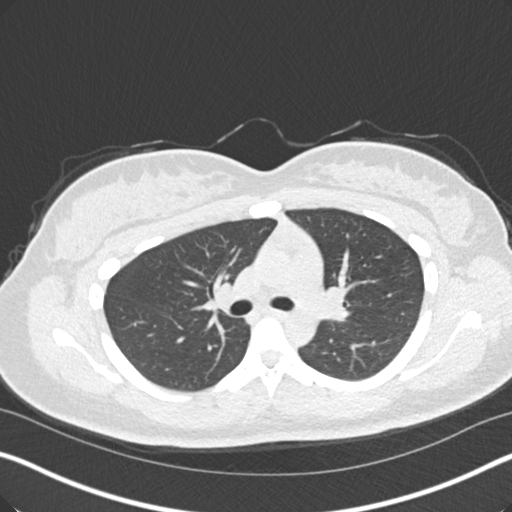
[im 101/146  mediastinal]
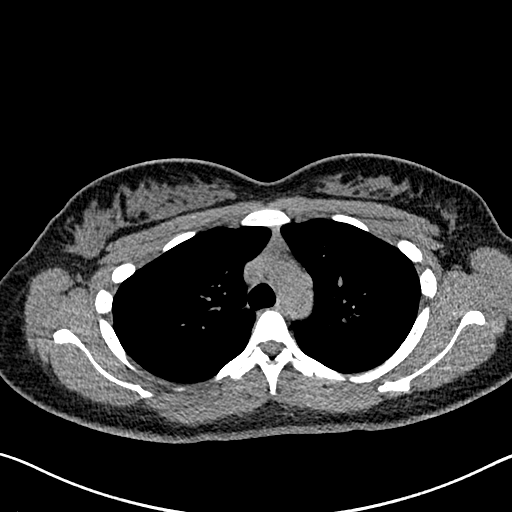
[im 101/146  lung]
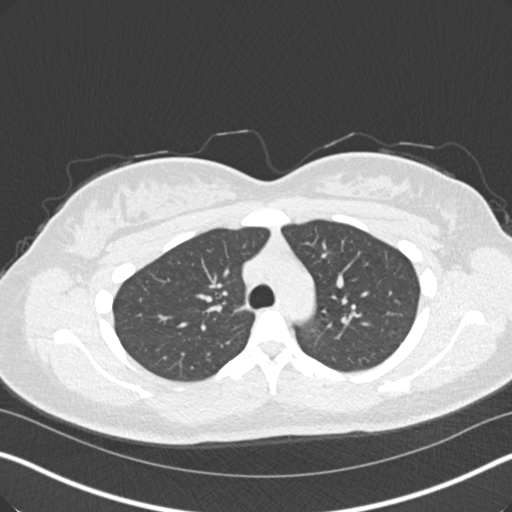
[im 112/146  lung]
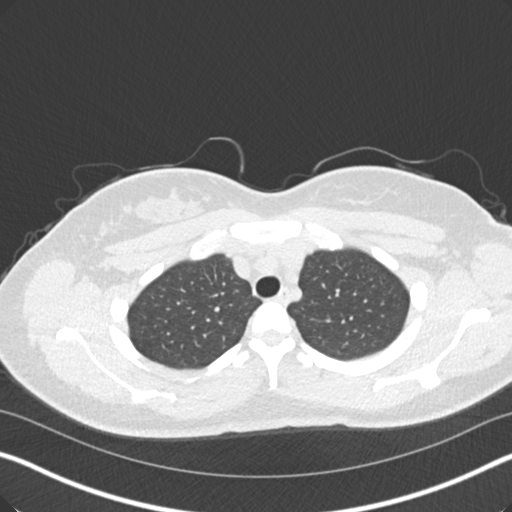
[im 123/146  lung]
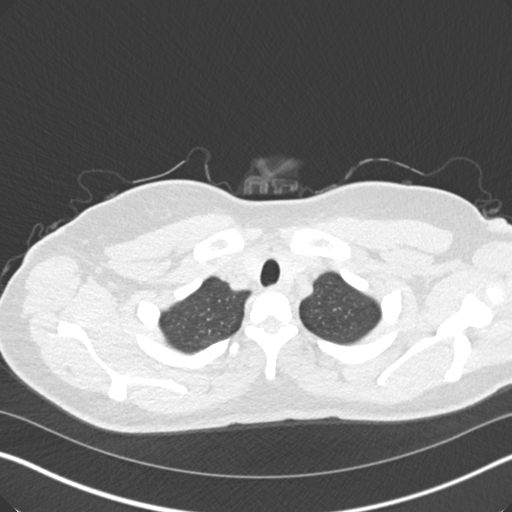
[im 134/146  lung]
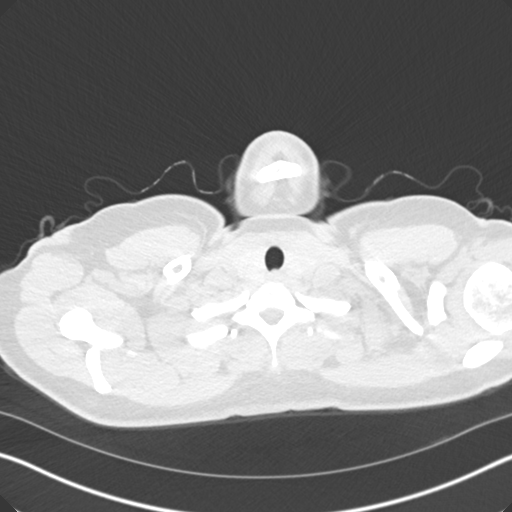

[Series 7: coronal · coronal · 0.59mm/px · 3 of 82 slices shown]
[im 17/82  lung]
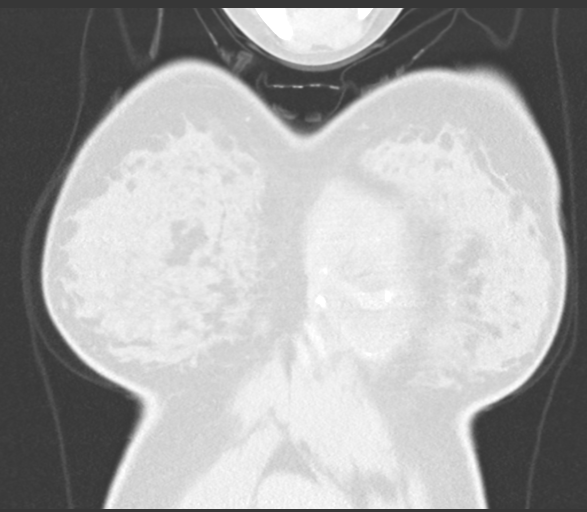
[im 33/82  lung]
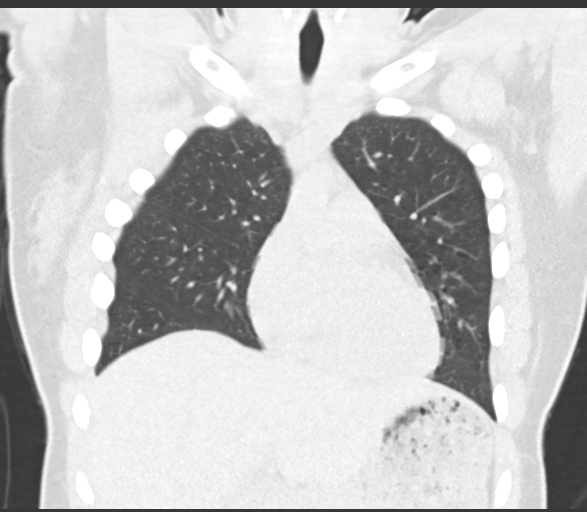
[im 49/82  lung]
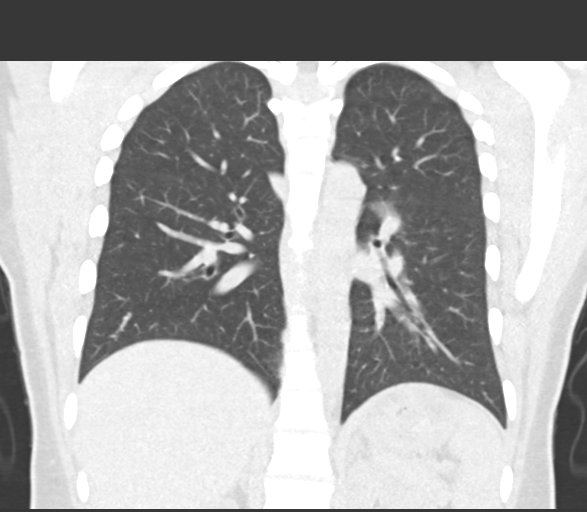

[15 of 36 positions shown; findings below may reference images not displayed]

FINDINGS: Cardiovascular: No significant vascular findings on noncontrast
imaging. The heart size is normal. There is no pericardial effusion.

Mediastinum/Nodes: There are no enlarged mediastinal, hilar or
axillary lymph nodes.Hilar assessment is limited by the lack of
intravenous contrast. Residual thymic tissue is noted, typical for
age. The thyroid gland, trachea and esophagus appear unremarkable.

Lungs/Pleura: No pleural effusion or pneumothorax. The lungs are
clear.

Upper abdomen:  The visualized upper abdomen is unremarkable.

Musculoskeletal/Chest wall: No marker was placed over any palpable
concern. No chest wall masses are identified. The breast tissue
appears symmetric. No asymmetric skin thickening identified. The
bones appear unremarkable.
IMPRESSION: 1. No evidence of chest wall or axillary mass. Clinical followup of
any palpable concern recommended. Ultrasound evaluation should be
considered, especially if this involves the breast tissue.
2. No acute chest findings.

## 2022-04-06 NOTE — Patient Instructions (Signed)

## 2022-04-10 ENCOUNTER — Encounter: Payer: Self-pay | Admitting: Nurse Practitioner

## 2022-04-10 ENCOUNTER — Other Ambulatory Visit: Payer: Self-pay | Admitting: Nurse Practitioner

## 2022-04-10 ENCOUNTER — Ambulatory Visit: Payer: No Typology Code available for payment source | Admitting: Nurse Practitioner

## 2022-04-10 VITALS — BP 114/73 | HR 70 | Temp 98.0°F | Ht 67.2 in | Wt 205.1 lb

## 2022-04-10 DIAGNOSIS — E6609 Other obesity due to excess calories: Secondary | ICD-10-CM

## 2022-04-10 DIAGNOSIS — Z3041 Encounter for surveillance of contraceptive pills: Secondary | ICD-10-CM

## 2022-04-10 DIAGNOSIS — Z7689 Persons encountering health services in other specified circumstances: Secondary | ICD-10-CM

## 2022-04-10 DIAGNOSIS — L2084 Intrinsic (allergic) eczema: Secondary | ICD-10-CM | POA: Diagnosis not present

## 2022-04-10 DIAGNOSIS — Z6828 Body mass index (BMI) 28.0-28.9, adult: Secondary | ICD-10-CM | POA: Insufficient documentation

## 2022-04-10 DIAGNOSIS — Z6831 Body mass index (BMI) 31.0-31.9, adult: Secondary | ICD-10-CM

## 2022-04-10 DIAGNOSIS — E669 Obesity, unspecified: Secondary | ICD-10-CM | POA: Insufficient documentation

## 2022-04-10 MED ORDER — NORETHINDRONE ACET-ETHINYL EST 1-20 MG-MCG PO TABS: 1.0000 | ORAL_TABLET | Freq: Every day | ORAL | 4 refills | Status: DC

## 2022-04-10 MED ORDER — EUCRISA 2 % EX OINT: TOPICAL_OINTMENT | CUTANEOUS | 4 refills | Status: DC

## 2022-04-10 MED ORDER — WEGOVY 0.5 MG/0.5ML ~~LOC~~ SOAJ: 0.5000 mg | SUBCUTANEOUS | 1 refills | Status: DC

## 2022-04-10 MED ORDER — WEGOVY 0.25 MG/0.5ML ~~LOC~~ SOAJ: 0.2500 mg | SUBCUTANEOUS | 0 refills | Status: DC

## 2022-04-10 NOTE — Assessment & Plan Note (Signed)
Ongoing issue since childhood, has tried topical steroids without benefit.  Start [place], educated her on this.  Referral to dermatology.  Recommend continue at home regimen.

## 2022-04-10 NOTE — Progress Notes (Signed)
New Patient Office Visit  Subjective    Patient ID: Jessica Melendez, female    DOB: [birth year]  Age: 31 y.o. MRN: [id number]  CC:  Chief Complaint  Patient presents with   Establish Care    Patient states she would like to discuss weight loss options, tried Ozempic in the past. Also would like a referral to dermatology for Eczema.     HPI Jessica Melendez presents for new patient visit to establish care.  Introduced to Designer, jewellery role and practice setting.  All questions answered.  Discussed provider/patient relationship and expectations.  Previously seen at [facility] -- moved from [place].  Is on birth control -- due for pap.  WEIGHT LOSS DISCUSSION: Would like to work on weight loss. Has tried Ozempic with significant nausea and vomiting -- tried in early June, did lose weight with this.  Once the medication got to 1.7 MG she noticed the side effects -- she tolerated lower doses.  No other weight loss medications tried.  Her goal is to get to 180 lbs. Over past 6 months:  Attends boxing classes in [place] two times a week + diet wise has cut our sodas and cut back on sugars.    ECZEMA Had a referral to dermatology from previous PCP, has not attended as of yet -- they have not called her.  Was placed on Mometasone and this is not offering benefit.  Eczema is worse at this time, has done been this bad since childhood -- started more around time she moved here from [place] + started new position with [employer].  Uses Eucerin Intensive Care which at times offers benefit. Duration:  chronic  Location: hands, arms, and legs  Itching: yes Burning: no Redness: no Oozing: no Scaling: yes Blisters: no Painful: no Fevers: no Change in detergents/soaps/personal care products: no History of same: yes Context: worse Alleviating factors: Eucerin Treatments attempted: Eucerin and Mometasone Shortness of breath: no  Throat/tongue swelling: no Myalgias/arthralgias: no   Outpatient  Encounter Medications as of 04/10/2022  Medication Sig   Crisaborole (EUCRISA) 2 % OINT Apply a thin film to affected area(s) 2 times daily   Multiple Vitamins-Minerals (MULTIVITAMIN PO) Take 1 tablet by mouth daily.    Semaglutide-Weight Management (WEGOVY) 0.25 MG/0.5ML SOAJ Inject 0.25 mg into the skin once a week.   [START ON 05/09/2022] Semaglutide-Weight Management (WEGOVY) 0.5 MG/0.5ML SOAJ Inject 0.5 mg into the skin once a week.   [DISCONTINUED] norethindrone-ethinyl estradiol (LOESTRIN) 1-20 MG-MCG tablet Take 1 tablet by mouth daily.   norethindrone-ethinyl estradiol (LOESTRIN) 1-20 MG-MCG tablet Take 1 tablet by mouth daily.   [DISCONTINUED] fluticasone (FLONASE) 50 MCG/ACT nasal spray Place 2 sprays into both nostrils daily.   [DISCONTINUED] norgestimate-ethinyl estradiol (ORTHO-CYCLEN) 0.25-35 MG-MCG tablet Take 1 tablet by mouth daily.   [DISCONTINUED] Semaglutide,0.25 or 0.'5MG'$ /DOS, (OZEMPIC, 0.25 OR 0.5 MG/DOSE,) 2 MG/1.5ML SOPN Inject 0.25 mg into the skin once a week.   [DISCONTINUED] vitamin B-12 (CYANOCOBALAMIN) 500 MCG tablet Take 500 mcg by mouth daily.   No facility-administered encounter medications on file as of 04/10/2022.    Past Medical History:  Diagnosis Date   Eczema     Past Surgical History:  Procedure Laterality Date   MASS EXCISION Right 10/07/2018   Procedure: EXCISION AXILLARY MASS;  Surgeon: Jules Husbands, MD;  Location: [facility] ORS;  Service: General;  Laterality: Right;    Family History  Problem Relation Age of Onset   Hypertension Father    Sleep apnea Father  Hypertension Paternal Grandmother    Hypertension Paternal Grandfather    Diabetes Paternal Grandfather     Social History   Socioeconomic History   Marital status: Single    Spouse name: Not on file   Number of children: Not on file   Years of education: Not on file   Highest education level: Not on file  Occupational History   Occupation: social worker    Comment: Airline pilot   Tobacco Use   Smoking status: Never   Smokeless tobacco: Never  Vaping Use   Vaping Use: Never used  Substance and Sexual Activity   Alcohol use: Yes    Comment: very rare use   Drug use: No   Sexual activity: Yes    Partners: Male    Birth control/protection: Pill  Other Topics Concern   Not on file  Social History Narrative   Highest level of education in Biology from [place].   She aspires to be a Marine scientist and then possibly to be a NP.   Works as a Programme researcher, broadcasting/film/video at [employer].   Enjoys playing soccer.    Social Determinants of Health   Financial Resource Strain: Low Risk  (04/10/2022)   Overall Financial Resource Strain (CARDIA)    Difficulty of Paying Living Expenses: Not hard at all  Food Insecurity: No Food Insecurity (04/10/2022)   Hunger Vital Sign    Worried About Running Out of Food in the Last Year: Never true    Ran Out of Food in the Last Year: Never true  Transportation Needs: No Transportation Needs (04/10/2022)   PRAPARE - Hydrologist (Medical): No    Lack of Transportation (Non-Medical): No  Physical Activity: Insufficiently Active (04/10/2022)   Exercise Vital Sign    Days of Exercise per Week: 4 days    Minutes of Exercise per Session: 30 min  Stress: No Stress Concern Present (04/10/2022)   [place]    Feeling of Stress : Not at all  Social Connections: Moderately Isolated (04/10/2022)   Social Connection and Isolation Panel [NHANES]    Frequency of Communication with Friends and Family: Three times a week    Frequency of Social Gatherings with Friends and Family: Three times a week    Attends Religious Services: Never    Active Member of Clubs or Organizations: No    Attends Archivist Meetings: Never    Marital Status: Living with partner  Intimate Partner Violence: Not At Risk (04/10/2022)   Humiliation, Afraid, Rape, and Kick questionnaire    Fear  of Current or Ex-Partner: No    Emotionally Abused: No    Physically Abused: No    Sexually Abused: No    Review of Systems  Constitutional:  Negative for chills, diaphoresis, fever and weight loss.  Respiratory:  Negative for cough, shortness of breath and wheezing.   Cardiovascular:  Negative for chest pain, palpitations, orthopnea and leg swelling.  Neurological: Negative.   Endo/Heme/Allergies: Negative.   Psychiatric/Behavioral: Negative.        Objective    BP 114/73   Pulse 70   Temp 98 F (36.7 C) (Oral)   Ht 5' 7.2" (1.707 m)   Wt 205 lb 1.6 oz (93 kg)   LMP 03/20/2022 (Approximate)   SpO2 98%   BMI 31.93 kg/m   Physical Exam Vitals and nursing note reviewed.  Constitutional:      General: She is awake. She  is not in acute distress.    Appearance: She is well-developed and well-groomed. She is obese. She is not ill-appearing or toxic-appearing.  HENT:     Head: Normocephalic.     Right Ear: Hearing normal.     Left Ear: Hearing normal.  Eyes:     General: Lids are normal.        Right eye: No discharge.        Left eye: No discharge.     Conjunctiva/sclera: Conjunctivae normal.     Pupils: Pupils are equal, round, and reactive to light.  Neck:     Thyroid: No thyromegaly.     Vascular: No carotid bruit.  Cardiovascular:     Rate and Rhythm: Normal rate and regular rhythm.     Heart sounds: Normal heart sounds. No murmur heard.    No gallop.  Pulmonary:     Effort: Pulmonary effort is normal. No accessory muscle usage or respiratory distress.     Breath sounds: Normal breath sounds.  Abdominal:     General: Bowel sounds are normal.     Palpations: Abdomen is soft. There is no hepatomegaly or splenomegaly.  Musculoskeletal:     Cervical back: Normal range of motion and neck supple.     Right lower leg: No edema.     Left lower leg: No edema.  Lymphadenopathy:     Cervical: No cervical adenopathy.  Skin:    General: Skin is warm and dry.   Neurological:     Mental Status: She is alert and oriented to person, place, and time.  Psychiatric:        Attention and Perception: Attention normal.        Mood and Affect: Mood normal.        Speech: Speech normal.        Behavior: Behavior normal. Behavior is cooperative.        Thought Content: Thought content normal.           Assessment & Plan:   Problem List Items Addressed This Visit       Musculoskeletal and Integument   Intrinsic eczema - Primary    Ongoing issue since childhood, has tried topical steroids without benefit.  Start [place], educated her on this.  Referral to dermatology.  Recommend continue at home regimen.      Relevant Orders   Ambulatory referral to Dermatology     Other   Contraception management    Plan on pap update next visit + labs.  Refills on BCP sent today.      Obesity    BMI 31.93.  She would like to try Semaglutide again [facility]) at lower doses -- educated her on how to reduce side effects (small, more frequent healthy meals vs big meals in one sitting to reduce N&V + plenty of hydration) -- she did well on lower doses in past.  No family history of thyroid cancer (MTC, MEN 2, thyroid cell tumors) or pancreatitis.  Sample of 0.25 MG provided in office and will send in 0.5 MG dosing, may need PA.  Recommended eating smaller high protein, low fat meals more frequently and exercising 30 mins a day 5 times a week with a goal of 10-15lb weight loss in the next 3 months. Patient voiced their understanding and motivation to adhere to these recommendations.      Relevant Medications   Semaglutide-Weight Management (WEGOVY) 0.25 MG/0.5ML SOAJ   Semaglutide-Weight Management (WEGOVY) 0.5 MG/0.5ML SOAJ (Start on 05/09/2022)  Other Visit Diagnoses     Encounter to establish care       New patient to clinic, introduce to provider and clinic setting.       Return in about 4 weeks (around 05/08/2022) for Annual Physical with pap.   Venita Lick, NP

## 2022-04-10 NOTE — Assessment & Plan Note (Signed)
BMI 31.93.  She would like to try Semaglutide again [facility]) at lower doses -- educated her on how to reduce side effects (small, more frequent healthy meals vs big meals in one sitting to reduce N&V + plenty of hydration) -- she did well on lower doses in past.  No family history of thyroid cancer (MTC, MEN 2, thyroid cell tumors) or pancreatitis.  Sample of 0.25 MG provided in office and will send in 0.5 MG dosing, may need PA.  Recommended eating smaller high protein, low fat meals more frequently and exercising 30 mins a day 5 times a week with a goal of 10-15lb weight loss in the next 3 months. Patient voiced their understanding and motivation to adhere to these recommendations.

## 2022-04-10 NOTE — Assessment & Plan Note (Signed)
Plan on pap update next visit + labs.  Refills on BCP sent today.

## 2022-04-11 ENCOUNTER — Other Ambulatory Visit: Payer: Self-pay | Admitting: Nurse Practitioner

## 2022-04-11 NOTE — Telephone Encounter (Signed)
Requested medication (s) are due for refill today: no  Requested medication (s) are on the active medication list: yes  Future visit scheduled: yes  Notes to clinic:  alternative requested by pharmacy.      Requested Prescriptions  Pending Prescriptions Disp Refills   EUCRISA 2 % OINT [Pharmacy Med Name: EUCRISA 2% OINTMENT] 60 g 4    Sig: Apply a thin film to affected area(s) 2 times daily     Off-Protocol Failed - 04/11/2022 11:52 AM      Failed - Medication not assigned to a protocol, review manually.      Passed - Valid encounter within last 12 months    Recent Outpatient Visits           [place], Barbaraann Faster, NP       Future Appointments             In 3 weeks Venita Lick, NP [facility], [place]   In 3 months Ralene Bathe, MD [place]

## 2022-04-11 NOTE — Telephone Encounter (Signed)
   Notes to clinic:  Request from pharm on med ordered yesterday  Pharmacy comment: Alternative Requested:NOT COVERED.  PRIOR AUTH NEEDED.     Requested Prescriptions  Pending Prescriptions Disp Refills   EUCRISA 2 % OINT [Pharmacy Med Name: EUCRISA 2% OINTMENT] 60 g 4    Sig: Apply a thin film to affected area(s) 2 times daily     Off-Protocol Failed - 04/10/2022  9:18 AM      Failed - Medication not assigned to a protocol, review manually.      Passed - Valid encounter within last 12 months    Recent Outpatient Visits           [place], Barbaraann Faster, NP       Future Appointments             In 3 weeks Venita Lick, NP [facility], [place]   In 3 months Ralene Bathe, MD [place]

## 2022-04-14 ENCOUNTER — Telehealth: Payer: Self-pay

## 2022-04-14 MED ORDER — TACROLIMUS 0.03 % EX OINT
TOPICAL_OINTMENT | Freq: Two times a day (BID) | CUTANEOUS | 2 refills | Status: DC
Start: 2022-04-14 — End: 2024-05-31

## 2022-04-14 NOTE — Telephone Encounter (Signed)
PA for Wegovy 0.5 mg initiated and submitted via Cover My Meds. Key: Ermalinda Memos

## 2022-04-14 NOTE — Addendum Note (Signed)
Addended by: Marnee Guarneri T on: 04/14/2022 08:41 AM   Modules accepted: Orders

## 2022-04-24 ENCOUNTER — Ambulatory Visit: Payer: Self-pay | Admitting: Nurse Practitioner

## 2022-05-04 NOTE — Patient Instructions (Signed)

## 2022-05-08 ENCOUNTER — Telehealth: Payer: Self-pay | Admitting: Nurse Practitioner

## 2022-05-08 ENCOUNTER — Encounter: Payer: Self-pay | Admitting: Nurse Practitioner

## 2022-05-08 ENCOUNTER — Ambulatory Visit (INDEPENDENT_AMBULATORY_CARE_PROVIDER_SITE_OTHER): Payer: No Typology Code available for payment source | Admitting: Nurse Practitioner

## 2022-05-08 ENCOUNTER — Other Ambulatory Visit (HOSPITAL_COMMUNITY)
Admission: RE | Admit: 2022-05-08 | Discharge: 2022-05-08 | Disposition: A | Discharge status: Home / Self Care | Payer: No Typology Code available for payment source | Source: Ambulatory Visit | Attending: Nurse Practitioner | Admitting: Nurse Practitioner

## 2022-05-08 VITALS — BP 104/70 | HR 64 | Temp 99.1°F | Ht 67.0 in | Wt 203.6 lb

## 2022-05-08 DIAGNOSIS — E559 Vitamin D deficiency, unspecified: Secondary | ICD-10-CM

## 2022-05-08 DIAGNOSIS — Z3041 Encounter for surveillance of contraceptive pills: Secondary | ICD-10-CM | POA: Diagnosis not present

## 2022-05-08 DIAGNOSIS — Z124 Encounter for screening for malignant neoplasm of cervix: Secondary | ICD-10-CM

## 2022-05-08 DIAGNOSIS — Z0000 Encounter for general adult medical examination without abnormal findings: Secondary | ICD-10-CM

## 2022-05-08 DIAGNOSIS — Z6831 Body mass index (BMI) 31.0-31.9, adult: Secondary | ICD-10-CM

## 2022-05-08 DIAGNOSIS — Z136 Encounter for screening for cardiovascular disorders: Secondary | ICD-10-CM

## 2022-05-08 DIAGNOSIS — Z114 Encounter for screening for human immunodeficiency virus [HIV]: Secondary | ICD-10-CM | POA: Diagnosis not present

## 2022-05-08 DIAGNOSIS — E6609 Other obesity due to excess calories: Secondary | ICD-10-CM

## 2022-05-08 DIAGNOSIS — Z13220 Encounter for screening for lipoid disorders: Secondary | ICD-10-CM

## 2022-05-08 DIAGNOSIS — L2084 Intrinsic (allergic) eczema: Secondary | ICD-10-CM

## 2022-05-08 LAB — PREGNANCY, URINE: Preg Test, Ur: NEGATIVE

## 2022-05-08 MED ORDER — WEGOVY 0.5 MG/0.5ML ~~LOC~~ SOAJ: 0.5000 mg | SUBCUTANEOUS | 1 refills | Status: DC

## 2022-05-08 NOTE — Assessment & Plan Note (Signed)
Ongoing issue since childhood, has tried topical steroids without benefit.  Continue collaboration with dermatology.

## 2022-05-08 NOTE — Progress Notes (Signed)
BP 104/70   Pulse 64   Temp 99.1 F (37.3 C) (Oral)   Ht '5\' 7"'$  (1.702 m)   Wt 203 lb 9.6 oz (92.4 kg)   LMP 03/20/2022 (Approximate)   BMI 31.89 kg/m    Subjective:    Patient ID: Jessica Melendez, female    DOB: 02-12-1991, 31 y.o.   MRN: [id number]  HPI: Jessica Melendez is a 31 y.o. female presenting on 05/08/2022 for comprehensive medical examination. Current medical complaints include:none  She currently lives with: self Menopausal Symptoms: no  Depression Screen done today and results listed below:     05/08/2022    9:08 AM 04/10/2022    8:45 AM  Depression screen PHQ 2/9  Decreased Interest 1 1  Down, Depressed, Hopeless 0 0  PHQ - 2 Score 1 1  Altered sleeping 0 0  Tired, decreased energy 1 1  Change in appetite 0 0  Feeling bad or failure about yourself  0 0  Trouble concentrating 0 0  Moving slowly or fidgety/restless 0 0  Suicidal thoughts 0 0  PHQ-9 Score 2 2  Difficult doing work/chores  Not difficult at all       09/13/2018   10:02 AM 05/08/2022    9:08 AM 05/08/2022    9:24 AM  Fall Risk  Falls in the past year? 0 0 0  Was there an injury with Fall?  0 0  Fall Risk Category Calculator  0 0  Fall Risk Category  Low Low  Patient Fall Risk Level Low fall risk Low fall risk Low fall risk  Patient at Risk for Falls Due to  No Fall Risks No Fall Risks  Fall risk Follow up  Falls evaluation completed Falls prevention discussed    Functional Status Survey: Is the patient deaf or have difficulty hearing?: No Does the patient have difficulty seeing, even when wearing glasses/contacts?: No Does the patient have difficulty concentrating, remembering, or making decisions?: No Does the patient have difficulty walking or climbing stairs?: No Does the patient have difficulty dressing or bathing?: No Does the patient have difficulty doing errands alone such as visiting a doctor's office or shopping?: No   Past Medical History:  Past Medical History:  Diagnosis Date    Eczema     Surgical History:  Past Surgical History:  Procedure Laterality Date   MASS EXCISION Right 10/07/2018   Procedure: EXCISION AXILLARY MASS;  Surgeon: Jules Husbands, MD;  Location: [facility] ORS;  Service: General;  Laterality: Right;    Medications:  Current Outpatient Medications on File Prior to Visit  Medication Sig   Multiple Vitamins-Minerals (MULTIVITAMIN PO) Take 1 tablet by mouth daily.    norethindrone-ethinyl estradiol (LOESTRIN) 1-20 MG-MCG tablet Take 1 tablet by mouth daily.   Semaglutide-Weight Management (WEGOVY) 0.25 MG/0.5ML SOAJ Inject 0.25 mg into the skin once a week.   tacrolimus (PROTOPIC) 0.03 % ointment Apply topically 2 (two) times daily.   No current facility-administered medications on file prior to visit.    Allergies:  No Known Allergies  Social History:  Social History   Socioeconomic History   Marital status: Single    Spouse name: Not on file   Number of children: Not on file   Years of education: Not on file   Highest education level: Not on file  Occupational History   Occupation: social worker    Comment: Airline pilot  Tobacco Use   Smoking status: Never   Smokeless tobacco: Never  Vaping Use  Vaping Use: Never used  Substance and Sexual Activity   Alcohol use: Yes    Comment: very rare use   Drug use: No   Sexual activity: Yes    Partners: Male    Birth control/protection: Pill  Other Topics Concern   Not on file  Social History Narrative   Highest level of education in Biology from [place].   She aspires to be a Marine scientist and then possibly to be a NP.   Works as a Programme researcher, broadcasting/film/video at [employer].   Enjoys playing soccer.    Social Determinants of Health   Financial Resource Strain: Low Risk  (04/10/2022)   Overall Financial Resource Strain (CARDIA)    Difficulty of Paying Living Expenses: Not hard at all  Food Insecurity: No Food Insecurity (04/10/2022)   Hunger Vital Sign    Worried About Running Out of Food in the Last  Year: Never true    Ran Out of Food in the Last Year: Never true  Transportation Needs: No Transportation Needs (04/10/2022)   PRAPARE - Hydrologist (Medical): No    Lack of Transportation (Non-Medical): No  Physical Activity: Insufficiently Active (04/10/2022)   Exercise Vital Sign    Days of Exercise per Week: 4 days    Minutes of Exercise per Session: 30 min  Stress: No Stress Concern Present (04/10/2022)   [place]    Feeling of Stress : Not at all  Social Connections: Moderately Isolated (04/10/2022)   Social Connection and Isolation Panel [NHANES]    Frequency of Communication with Friends and Family: Three times a week    Frequency of Social Gatherings with Friends and Family: Three times a week    Attends Religious Services: Never    Active Member of Clubs or Organizations: No    Attends Archivist Meetings: Never    Marital Status: Living with partner  Intimate Partner Violence: Not At Risk (04/10/2022)   Humiliation, Afraid, Rape, and Kick questionnaire    Fear of Current or Ex-Partner: No    Emotionally Abused: No    Physically Abused: No    Sexually Abused: No   Social History   Tobacco Use  Smoking Status Never  Smokeless Tobacco Never   Social History   Substance and Sexual Activity  Alcohol Use Yes   Comment: very rare use    Family History:  Family History  Problem Relation Age of Onset   Hypertension Father    Sleep apnea Father    Hypertension Paternal Grandmother    Hypertension Paternal Grandfather    Diabetes Paternal Grandfather     Past medical history, surgical history, medications, allergies, family history and social history reviewed with patient today and changes made to appropriate areas of the chart.   ROS All other ROS negative except what is listed above and in the HPI.      Objective:    BP 104/70   Pulse 64   Temp 99.1 F  (37.3 C) (Oral)   Ht '5\' 7"'$  (1.702 m)   Wt 203 lb 9.6 oz (92.4 kg)   LMP 03/20/2022 (Approximate)   BMI 31.89 kg/m   Wt Readings from Last 3 Encounters:  05/08/22 203 lb 9.6 oz (92.4 kg)  04/10/22 205 lb 1.6 oz (93 kg)  10/20/18 178 lb (80.7 kg)    Physical Exam  Results for orders placed or performed during the hospital encounter of 10/07/18  Pregnancy, urine POC  Result Value Ref Range   Preg Test, Ur NEGATIVE NEGATIVE  Surgical pathology  Result Value Ref Range   SURGICAL PATHOLOGY      Surgical Pathology CASE: [id number] PATIENT: Ayerim Halfhill Surgical Pathology Report     SPECIMEN SUBMITTED: A. Axilla, lipoma, right  CLINICAL HISTORY: None provided  PRE-OPERATIVE DIAGNOSIS: Axillary mass  POST-OPERATIVE DIAGNOSIS: Same as pre-op     DIAGNOSIS: A. SOFT TISSUE, RIGHT AXILLA; EXCISION: - BENIGN LIPOMA. - NEGATIVE FOR ATYPIA AND MALIGNANCY.   GROSS DESCRIPTION: A. Labeled: Lipoma right axilla Received: Formalin Tissue fragment(s): 1 Size: 10.0 x 6.5 x 2.5 cm Description: An irregular fragment of yellow lobulated adipose tissue partially covered by a thin gray-white membrane.  The specimen is inked black, serially sectioned to reveal a glistening, yellow cut surface with no discrete hemorrhage or necrosis. Representatively submitted in A1-A4.   Final Diagnosis performed by Allena Napoleon, MD.   Electronically signed 10/08/2018 1:30:33PM The electronic signature indicates that the named Attending Pathologist has evaluated the spe cimen  Technical component performed at [facility], [address] [place], [place] [address] Lab: [phone] Dir: Rush Farmer, MD, MMM  Professional component performed at [facility], [facility], [place], [place], [place] [address] Lab: [phone] Dir: Dellia Nims. Reuel Derby, MD       Assessment & Plan:   Problem List Items Addressed This Visit       Musculoskeletal and Integument   Intrinsic  eczema - Primary    Ongoing issue since childhood, has tried topical steroids without benefit.  Continue collaboration with dermatology.      Relevant Orders   CBC with Differential/Platelet     Other   Contraception management    Continue current regimen and check on pap today and urine pregnancy.        Relevant Orders   Comprehensive metabolic panel   Pregnancy, urine   Obesity    BMI 31.89.  She is starting [place] weekly. No family history of thyroid cancer (MTC, MEN 2, thyroid cell tumors) or pancreatitis.  Starting 0.25 MG weekly and will send in 0.5 MG dosing, may need PA.  Recommended eating smaller high protein, low fat meals more frequently and exercising 30 mins a day 5 times a week with a goal of 10-15lb weight loss in the next 3 months. Patient voiced their understanding and motivation to adhere to these recommendations. Plan on return in 4 weeks virtual.      Relevant Medications   Semaglutide-Weight Management (WEGOVY) 0.5 MG/0.5ML SOAJ (Start on 05/09/2022)   Other Relevant Orders   Lipid Panel w/o Chol/HDL Ratio   TSH   Other Visit Diagnoses     Vitamin D deficiency       History of low levels reports, check today and start supplement as needed.   Relevant Orders   VITAMIN D 25 Hydroxy (Vit-D Deficiency, Fractures)   Encounter for lipid screening for cardiovascular disease       Lipid panel on labs today.   Relevant Orders   Lipid Panel w/o Chol/HDL Ratio   Cervical cancer screening       Pap obtained today and sent to lab.   Relevant Orders   Cytology - PAP   Encounter for screening for HIV       HIV screen on labs today per guidelines for one time screening, discussed with patient.   Relevant Orders   HIV Antibody (routine testing w rflx)   Encounter for annual physical exam  Annual physical today with labs and health maintenance reviewed, discussed with patient.   Relevant Orders   CBC with Differential/Platelet   TSH        Follow up  plan: Return in about 4 weeks (around 06/05/2022) for WEIGHT CHECK VIRTUAL.   LABORATORY TESTING:  - Pap smear: pap done  IMMUNIZATIONS:   - Tdap: Tetanus vaccination status reviewed: last tetanus booster within 10 years. - Influenza: Refused - Pneumovax: Not applicable - Prevnar: Not applicable - COVID: Up to date - HPV: Not applicable - Shingrix vaccine: Not applicable  SCREENING: -Mammogram: Not applicable  - Colonoscopy: Not applicable  - Bone Density: Not applicable  -Hearing Test: Not applicable  -Spirometry: Not applicable   PATIENT COUNSELING:   Advised to take 1 mg of folate supplement per day if capable of pregnancy.   Sexuality: Discussed sexually transmitted diseases, partner selection, use of condoms, avoidance of unintended pregnancy  and contraceptive alternatives.   Advised to avoid cigarette smoking.  I discussed with the patient that most people either abstain from alcohol or drink within safe limits (<=14/week and <=4 drinks/occasion for males, <=7/weeks and <= 3 drinks/occasion for females) and that the risk for alcohol disorders and other health effects rises proportionally with the number of drinks per week and how often a drinker exceeds daily limits.  Discussed cessation/primary prevention of drug use and availability of treatment for abuse.   Diet: Encouraged to adjust caloric intake to maintain  or achieve ideal body weight, to reduce intake of dietary saturated fat and total fat, to limit sodium intake by avoiding high sodium foods and not adding table salt, and to maintain adequate dietary potassium and calcium preferably from fresh fruits, vegetables, and low-fat dairy products.    stressed the importance of regular exercise  Injury prevention: Discussed safety belts, safety helmets, smoke detector, smoking near bedding or upholstery.   Dental health: Discussed importance of regular tooth brushing, flossing, and dental visits.    NEXT  PREVENTATIVE PHYSICAL DUE IN 1 YEAR. Return in about 4 weeks (around 06/05/2022) for WEIGHT CHECK VIRTUAL.

## 2022-05-08 NOTE — Telephone Encounter (Signed)
Pt reports that the Rx for Semaglutide-Weight Management (WEGOVY) 0.5 MG/0.5ML SOAJ is on a national backorder but she was able to locate Semaglutide-Weight Management (WEGOVY) 1 MG SOAJ at  [place] Phone:  [phone]  Fax:  [phone]     Pt requests that a Rx for the 1 MG dose be sent to [facility]

## 2022-05-08 NOTE — Assessment & Plan Note (Signed)
BMI 31.89.  She is starting [place] weekly. No family history of thyroid cancer (MTC, MEN 2, thyroid cell tumors) or pancreatitis.  Starting 0.25 MG weekly and will send in 0.5 MG dosing, may need PA.  Recommended eating smaller high protein, low fat meals more frequently and exercising 30 mins a day 5 times a week with a goal of 10-15lb weight loss in the next 3 months. Patient voiced their understanding and motivation to adhere to these recommendations. Plan on return in 4 weeks virtual.

## 2022-05-08 NOTE — Assessment & Plan Note (Signed)
Continue current regimen and check on pap today and urine pregnancy.

## 2022-05-09 ENCOUNTER — Other Ambulatory Visit: Payer: Self-pay | Admitting: Nurse Practitioner

## 2022-05-09 LAB — CBC WITH DIFFERENTIAL/PLATELET
Basophils Absolute: 0 10*3/uL (ref 0.0–0.2)
Basos: 0 %
EOS (ABSOLUTE): 0.1 10*3/uL (ref 0.0–0.4)
Eos: 1 %
Hematocrit: 44 % (ref 34.0–46.6)
Hemoglobin: 14.9 g/dL (ref 11.1–15.9)
Immature Grans (Abs): 0 10*3/uL (ref 0.0–0.1)
Immature Granulocytes: 0 %
Lymphocytes Absolute: 2.7 10*3/uL (ref 0.7–3.1)
Lymphs: 36 %
MCH: 31.7 pg (ref 26.6–33.0)
MCHC: 33.9 g/dL (ref 31.5–35.7)
MCV: 94 fL (ref 79–97)
Monocytes Absolute: 0.6 10*3/uL (ref 0.1–0.9)
Monocytes: 8 %
Neutrophils Absolute: 4.2 10*3/uL (ref 1.4–7.0)
Neutrophils: 55 %
Platelets: 353 10*3/uL (ref 150–450)
RBC: 4.7 x10E6/uL (ref 3.77–5.28)
RDW: 11.3 % — ABNORMAL LOW (ref 11.7–15.4)
WBC: 7.6 10*3/uL (ref 3.4–10.8)

## 2022-05-09 LAB — COMPREHENSIVE METABOLIC PANEL
ALT: 43 IU/L — ABNORMAL HIGH (ref 0–32)
AST: 21 IU/L (ref 0–40)
Albumin/Globulin Ratio: 1.4 (ref 1.2–2.2)
Albumin: 4.4 g/dL (ref 4.0–5.0)
Alkaline Phosphatase: 62 IU/L (ref 44–121)
BUN/Creatinine Ratio: 9 (ref 9–23)
BUN: 9 mg/dL (ref 6–20)
Bilirubin Total: 0.3 mg/dL (ref 0.0–1.2)
CO2: 23 mmol/L (ref 20–29)
Calcium: 9.4 mg/dL (ref 8.7–10.2)
Chloride: 102 mmol/L (ref 96–106)
Creatinine, Ser: 0.99 mg/dL (ref 0.57–1.00)
Globulin, Total: 3.2 g/dL (ref 1.5–4.5)
Glucose: 85 mg/dL (ref 70–99)
Potassium: 3.8 mmol/L (ref 3.5–5.2)
Sodium: 140 mmol/L (ref 134–144)
Total Protein: 7.6 g/dL (ref 6.0–8.5)
eGFR: 79 mL/min/{1.73_m2} (ref >59)

## 2022-05-09 LAB — TSH: TSH: 0.486 u[IU]/mL (ref 0.450–4.500)

## 2022-05-09 LAB — VITAMIN D 25 HYDROXY (VIT D DEFICIENCY, FRACTURES): Vit D, 25-Hydroxy: 31.3 ng/mL (ref 30.0–100.0)

## 2022-05-09 LAB — LIPID PANEL W/O CHOL/HDL RATIO
Cholesterol, Total: 162 mg/dL (ref 100–199)
HDL: 48 mg/dL (ref >39)
LDL Chol Calc (NIH): 99 mg/dL (ref 0–99)
Triglycerides: 76 mg/dL (ref 0–149)
VLDL Cholesterol Cal: 15 mg/dL (ref 5–40)

## 2022-05-09 LAB — HIV ANTIBODY (ROUTINE TESTING W REFLEX): HIV Screen 4th Generation wRfx: NONREACTIVE

## 2022-05-09 MED ORDER — WEGOVY 0.5 MG/0.5ML ~~LOC~~ SOAJ: 0.5000 mg | SUBCUTANEOUS | 1 refills | Status: DC

## 2022-05-09 NOTE — Progress Notes (Signed)
Contacted via [place] afternoon [place], labs have returned and overall these are nice and stable with a couple mild elevations present in two things, ALT is liver test and this is mildly elevated.  We will continue to monitor this closely.  Any questions? Keep being amazing!!  Thank you for allowing me to participate in your care.  I appreciate you. Kindest regards, Yehudit Fulginiti

## 2022-05-12 LAB — CYTOLOGY - PAP: Diagnosis: NEGATIVE

## 2022-05-12 NOTE — Progress Notes (Signed)
Contacted via MyChart   Normal pap smear!!  Woohoo!!  Can repeat in 3 years.

## 2022-06-06 ENCOUNTER — Telehealth: Payer: Self-pay | Admitting: Nurse Practitioner

## 2022-06-06 MED ORDER — WEGOVY 0.5 MG/0.5ML ~~LOC~~ SOAJ: 0.5000 mg | SUBCUTANEOUS | 1 refills | Status: DC

## 2022-06-06 NOTE — Telephone Encounter (Signed)
Medication Refill - Medication: Semaglutide-Weight Management (WEGOVY) 0.5 MG/0.5ML SOAJ  Has the patient contacted their pharmacy? Yes.   Pt calling [facility].. she never got to pick up the Rx sent in Sept.  Preferred Pharmacy (with phone number or street name): [facility] [place] [place] [facility]  Has the patient been seen for an appointment in the last year OR does the patient have an upcoming appointment? Yes.    Agent: Please be advised that RX refills may take up to 3 business days. We ask that you follow-up with your pharmacy.

## 2022-06-06 NOTE — Telephone Encounter (Signed)
Requested Prescriptions  Pending Prescriptions Disp Refills  . Semaglutide-Weight Management (WEGOVY) 0.5 MG/0.5ML SOAJ 2 mL 1    Sig: Inject 0.5 mg into the skin once a week.     Endocrinology:  Diabetes - GLP-1 Receptor Agonists - semaglutide Failed - 06/06/2022  3:02 PM      Failed - HBA1C in normal range and within 180 days    No results found for: "HGBA1C", "LABA1C"       Passed - Cr in normal range and within 360 days    Creatinine, Ser  Date Value Ref Range Status  05/08/2022 0.99 0.57 - 1.00 mg/dL Final         Passed - Valid encounter within last 6 months    Recent Outpatient Visits          4 weeks ago [place], Barbaraann Faster, NP   1 month ago [place], Barbaraann Faster, NP      Future Appointments            In 1 month Cannady, Barbaraann Faster, NP [facility], [place]   In 1 month Ralene Bathe, MD [place]

## 2022-06-11 NOTE — Telephone Encounter (Signed)
[facility] calling stating Semaglutide-Weight Management (WEGOVY) 0.5 MG/0.5ML SOAJ, they are out, out of 2.5 and  do have 2.7 which states would probably be too large of a dose? They have no idea when they will have in stock.  Nicola Police [id number] [facility] [place] [place] to [address] [place] [address]  Phone: [phone] Fax: [phone]  Hours: Not open 24 hours

## 2022-06-12 MED ORDER — WEGOVY 1 MG/0.5ML ~~LOC~~ SOAJ: 1.0000 mg | SUBCUTANEOUS | 4 refills | Status: DC

## 2022-06-12 NOTE — Telephone Encounter (Signed)
Made patient aware of Jolene's recommendations regards to prescription and dosage via MyChart. Advise patient to give reach back to our office with any concerns.

## 2022-06-14 ENCOUNTER — Other Ambulatory Visit: Payer: Self-pay | Admitting: Nurse Practitioner

## 2022-06-17 NOTE — Telephone Encounter (Signed)
Requested medications are due for refill today.  yes  Requested medications are on the active medications list.  yes  Last refill. Ordered, not filled 06/12/2022 70m 4 rf  Future visit scheduled.   yes  Notes to clinic.  Pharmacy comment: WMancel ParsonsINJ '1MG'$ / SEMAGLUTIDE (WEIGHT MNGMT) SOLN AUTO-INJECTOR '1MG'$ /0.5ML is backordered. Please respond with appropriate changes or comment to Pharmacy to prescribe an alternate.  WEGOVY INJ '1MG'$ / SEMAGLUTIDE (WEIGHT MNGMT) SOLN AUTO-INJECTOR '1MG'$ /0.5ML is backordered. Please respond with appropriate changes or comment to Pharmacy to prescribe an alternate.    Requested Prescriptions  Pending Prescriptions Disp Refills   WTerlingua1 MG/0.5ML SOAJ [Pharmacy Med Name: WEGOVY (4) INJ '1MG'$ ]  4    Sig: PLEASE UPDATE FOR ANY MEDICATION CHANGE     Endocrinology:  Diabetes - GLP-1 Receptor Agonists - semaglutide Failed - 06/14/2022  2:33 PM      Failed - HBA1C in normal range and within 180 days    No results found for: "HGBA1C", "LABA1C"       Passed - Cr in normal range and within 360 days    Creatinine, Ser  Date Value Ref Range Status  05/08/2022 0.99 0.57 - 1.00 mg/dL Final         Passed - Valid encounter within last 6 months    Recent Outpatient Visits           1 month ago [place], NP   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

## 2022-07-05 NOTE — Patient Instructions (Signed)
Obesity, Adult ?Obesity is having too much body fat. Being obese means that your weight is more than what is healthy for you.  ?BMI (body mass index) is a number that explains how much body fat you have. If you have a BMI of 30 or more, you are obese. ?Obesity can cause serious health problems, such as: ?Stroke. ?Coronary artery disease (CAD). ?Type 2 diabetes. ?Some types of cancer. ?High blood pressure (hypertension). ?High cholesterol. ?Gallbladder stones. ?Obesity can also contribute to: ?Osteoarthritis. ?Sleep apnea. ?Infertility problems. ?What are the causes? ?Eating meals each day that are high in calories, sugar, and fat. ?Drinking a lot of drinks that have sugar in them. ?Being born with genes that may make you more likely to become obese. ?Having a medical condition that causes obesity. ?Taking certain medicines. ?Sitting a lot (having a sedentary lifestyle). ?Not getting enough sleep. ?What increases the risk? ?Having a family history of obesity. ?Living in an area with limited access to: ?Parks, recreation centers, or sidewalks. ?Healthy food choices, such as grocery stores and farmers' markets. ?What are the signs or symptoms? ?The main sign is having too much body fat. ?How is this treated? ?Treatment for this condition often includes changing your lifestyle. Treatment may include: ?Changing your diet. This may include making a healthy meal plan. ?Exercise. This may include activity that causes your heart to beat faster (aerobic exercise) and strength training. Work with your doctor to design a program that works for you. ?Medicine to help you lose weight. This may be used if you are not able to lose one pound a week after 6 weeks of healthy eating and more exercise. ?Treating conditions that cause the obesity. ?Surgery. Options may include gastric banding and gastric bypass. This may be done if: ?Other treatments have not helped to improve your condition. ?You have a BMI of 40 or higher. ?You have  life-threatening health problems related to obesity. ?Follow these instructions at home: ?Eating and drinking ? ?Follow advice from your doctor about what to eat and drink. Your doctor may tell you to: ?Limit fast food, sweets, and processed snack foods. ?Choose low-fat options. For example, choose low-fat milk instead of whole milk. ?Eat five or more servings of fruits or vegetables each day. ?Eat at home more often. This gives you more control over what you eat. ?Choose healthy foods when you eat out. ?Learn to read food labels. This will help you learn how much food is in one serving. ?Keep low-fat snacks available. ?Avoid drinks that have a lot of sugar in them. These include soda, fruit juice, iced tea with sugar, and flavored milk. ?Drink enough water to keep your pee (urine) pale yellow. ?Do not go on fad diets. ?Physical activity ?Exercise often, as told by your doctor. Most adults should get up to 150 minutes of moderate-intensity exercise every week.Ask your doctor: ?What types of exercise are safe for you. ?How often you should exercise. ?Warm up and stretch before being active. ?Do slow stretching after being active (cool down). ?Rest between times of being active. ?Lifestyle ?Work with your doctor and a food expert (dietitian) to set a weight-loss goal that is best for you. ?Limit your screen time. ?Find ways to reward yourself that do not involve food. ?Do not drink alcohol if: ?Your doctor tells you not to drink. ?You are pregnant, may be pregnant, or are planning to become pregnant. ?If you drink alcohol: ?Limit how much you have to: ?0-1 drink a day for women. ?0-2 drinks   a day for men. ?Know how much alcohol is in your drink. In the [place] one drink equals one 12 oz bottle of beer (355 mL), one 5 oz glass of wine (148 mL), or one 1? oz glass of hard liquor (44 mL). ?General instructions ?Keep a weight-loss journal. This can help you keep track of: ?The food that you eat. ?How much exercise you  get. ?Take over-the-counter and prescription medicines only as told by your doctor. ?Take vitamins and supplements only as told by your doctor. ?Think about joining a support group. ?Pay attention to your mental health as obesity can lead to depression or self esteem issues. ?Keep all follow-up visits. ?Contact a doctor if: ?You cannot meet your weight-loss goal after you have changed your diet and lifestyle for 6 weeks. ?You are having trouble breathing. ?Summary ?Obesity is having too much body fat. ?Being obese means that your weight is more than what is healthy for you. ?Work with your doctor to set a weight-loss goal. ?Get regular exercise as told by your doctor. ?This information is not intended to replace advice given to you by your health care provider. Make sure you discuss any questions you have with your health care provider. ?Document Revised: 03/19/2021 Document Reviewed: 03/19/2021 ?Elsevier Patient Education ? 2023 Elsevier Inc. ? ?

## 2022-07-07 ENCOUNTER — Telehealth (INDEPENDENT_AMBULATORY_CARE_PROVIDER_SITE_OTHER): Payer: 59 | Admitting: Nurse Practitioner

## 2022-07-07 ENCOUNTER — Encounter: Payer: Self-pay | Admitting: Nurse Practitioner

## 2022-07-07 VITALS — Ht 67.01 in | Wt 204.8 lb

## 2022-07-07 DIAGNOSIS — E6609 Other obesity due to excess calories: Secondary | ICD-10-CM

## 2022-07-07 DIAGNOSIS — Z6831 Body mass index (BMI) 31.0-31.9, adult: Secondary | ICD-10-CM

## 2022-07-07 MED ORDER — WEGOVY 1 MG/0.5ML ~~LOC~~ SOAJ: 1.0000 mg | SUBCUTANEOUS | 4 refills | Status: DC

## 2022-07-07 NOTE — Progress Notes (Signed)
Ht 5' 7.01" (1.702 m)   Wt 204 lb 12.8 oz (92.9 kg)   BMI 32.07 kg/m    Subjective:    Patient ID: Jessica Melendez, female    DOB: 31-Jan-1991, 31 y.o.   MRN: [id number]  HPI: Jessica Melendez is a 31 y.o. female  Chief Complaint  Patient presents with   Weight Check    Weight is 204.8lb    This visit was completed via video visit through MyChart due to the restrictions of the COVID-19 pandemic. All issues as above were discussed and addressed. Physical exam was done as above through visual confirmation on video through MyChart. If it was felt that the patient should be evaluated in the office, they were directed there. The patient verbally consented to this visit. Location of the patient: home Location of the provider: work Those involved with this call:  Provider: Marnee Guarneri, DNP CMA: Frazier Butt, [place] Desk/Registration: [employer]  Time spent on call:  21 minutes with patient face to face via video conference. More than 50% of this time was spent in counseling and coordination of care. 15 minutes total spent in review of patient's record and preparation of their chart.  I verified patient identity using two factors (patient name and date of birth). Patient consents verbally to being seen via telemedicine visit today.    WEIGHT LOSS DISCUSSION: Follow-up for weight.  Started Wegovy on 04/10/22.  Currently at 1 MG dosing -- has 2 injections left.  She is tolerating this well, minimal nausea in morning (eats yogurt and granola).  She does notice less appetite with 1 MG, has been on this dose for 2 weeks.  Has tried Ozempic with significant nausea and vomiting -- tried in early June 2023, did lose weight with this.  Once the medication got to 1.7 MG she noticed the side effects -- she tolerated lower doses.  No other weight loss medications tried.  Her goal is to get to 180 lbs. Over past 6 months:  Attends boxing classes in [place] three times a week + diet wise has cut our sodas  and cut back on sugars + fried foods -- eating smaller portions. Her initial weight was 205 lbs and currently she is at 204 lbs.  Relevant past medical, surgical, family and social history reviewed and updated as indicated. Interim medical history since our last visit reviewed. Allergies and medications reviewed and updated.  Review of Systems  Constitutional:  Negative for activity change, appetite change, diaphoresis, fatigue and fever.  Respiratory:  Negative for cough, chest tightness and shortness of breath.   Cardiovascular:  Negative for chest pain, palpitations and leg swelling.  Gastrointestinal: Negative.   Neurological: Negative.   Psychiatric/Behavioral: Negative.      Per HPI unless specifically indicated above     Objective:    Ht 5' 7.01" (1.702 m)   Wt 204 lb 12.8 oz (92.9 kg)   BMI 32.07 kg/m   Wt Readings from Last 3 Encounters:  07/07/22 204 lb 12.8 oz (92.9 kg)  05/08/22 203 lb 9.6 oz (92.4 kg)  04/10/22 205 lb 1.6 oz (93 kg)    Physical Exam Vitals and nursing note reviewed.  Constitutional:      General: She is awake. She is not in acute distress.    Appearance: She is well-developed. She is obese. She is not ill-appearing or toxic-appearing.  HENT:     Head: Normocephalic.     Right Ear: Hearing normal.  Left Ear: Hearing normal.  Eyes:     General: Lids are normal.        Right eye: No discharge.        Left eye: No discharge.     Conjunctiva/sclera: Conjunctivae normal.  Pulmonary:     Effort: Pulmonary effort is normal. No accessory muscle usage or respiratory distress.  Musculoskeletal:     Cervical back: Normal range of motion.  Neurological:     Mental Status: She is alert and oriented to person, place, and time.  Psychiatric:        Attention and Perception: Attention normal.        Mood and Affect: Mood normal.        Behavior: Behavior normal. Behavior is cooperative.        Thought Content: Thought content normal.         Judgment: Judgment normal.     Results for orders placed or performed in visit on 05/08/22  CBC with Differential/Platelet  Result Value Ref Range   WBC 7.6 3.4 - 10.8 x10E3/uL   RBC 4.70 3.77 - 5.28 x10E6/uL   Hemoglobin 14.9 11.1 - 15.9 g/dL   Hematocrit 44.0 34.0 - 46.6 %   MCV 94 79 - 97 fL   MCH 31.7 26.6 - 33.0 pg   MCHC 33.9 31.5 - 35.7 g/dL   RDW 11.3 (L) 11.7 - 15.4 %   Platelets 353 150 - 450 x10E3/uL   Neutrophils 55 Not Estab. %   Lymphs 36 Not Estab. %   Monocytes 8 Not Estab. %   Eos 1 Not Estab. %   Basos 0 Not Estab. %   Neutrophils Absolute 4.2 1.4 - 7.0 x10E3/uL   Lymphocytes Absolute 2.7 0.7 - 3.1 x10E3/uL   Monocytes Absolute 0.6 0.1 - 0.9 x10E3/uL   EOS (ABSOLUTE) 0.1 0.0 - 0.4 x10E3/uL   Basophils Absolute 0.0 0.0 - 0.2 x10E3/uL   Immature Granulocytes 0 Not Estab. %   Immature Grans (Abs) 0.0 0.0 - 0.1 x10E3/uL  Comprehensive metabolic panel  Result Value Ref Range   Glucose 85 70 - 99 mg/dL   BUN 9 6 - 20 mg/dL   Creatinine, Ser 0.99 0.57 - 1.00 mg/dL   eGFR 79 >59 mL/min/1.73   BUN/Creatinine Ratio 9 9 - 23   Sodium 140 134 - 144 mmol/L   Potassium 3.8 3.5 - 5.2 mmol/L   Chloride 102 96 - 106 mmol/L   CO2 23 20 - 29 mmol/L   Calcium 9.4 8.7 - 10.2 mg/dL   Total Protein 7.6 6.0 - 8.5 g/dL   Albumin 4.4 4.0 - 5.0 g/dL   Globulin, Total 3.2 1.5 - 4.5 g/dL   Albumin/Globulin Ratio 1.4 1.2 - 2.2   Bilirubin Total 0.3 0.0 - 1.2 mg/dL   Alkaline Phosphatase 62 44 - 121 IU/L   AST 21 0 - 40 IU/L   ALT 43 (H) 0 - 32 IU/L  Lipid Panel w/o Chol/HDL Ratio  Result Value Ref Range   Cholesterol, Total 162 100 - 199 mg/dL   Triglycerides 76 0 - 149 mg/dL   HDL 48 >39 mg/dL   VLDL Cholesterol Cal 15 5 - 40 mg/dL   LDL Chol Calc (NIH) 99 0 - 99 mg/dL  TSH  Result Value Ref Range   TSH 0.486 0.450 - 4.500 uIU/mL  VITAMIN D 25 Hydroxy (Vit-D Deficiency, Fractures)  Result Value Ref Range   Vit D, 25-Hydroxy 31.3 30.0 - 100.0 ng/mL  HIV Antibody  (  routine testing w rflx)  Result Value Ref Range   HIV Screen 4th Generation wRfx Non Reactive Non Reactive  Pregnancy, urine  Result Value Ref Range   Preg Test, Ur Negative Negative  Cytology - PAP  Result Value Ref Range   Adequacy      Satisfactory for evaluation; transformation zone component PRESENT.   Diagnosis      - Negative for intraepithelial lesion or malignancy (NILM)      Assessment & Plan:   Problem List Items Addressed This Visit       Other   Obesity - Primary    BMI 32.07, is losing slightly.  No family history of thyroid cancer (MTC, MEN 2, thyroid cell tumors) or pancreatitis.  Continue Wegovy at 1 MG at this time, could trial higher dose in future, however did not tolerate in past.  Recommended eating smaller high protein, low fat meals more frequently and exercising 30 mins a day 5 times a week with a goal of 10-15lb weight loss in the next 3 months. Patient voiced their understanding and motivation to adhere to these recommendations. Plan on return in 6 weeks virtual.      Relevant Medications   Semaglutide-Weight Management (WEGOVY) 1 MG/0.5ML SOAJ    I discussed the assessment and treatment plan with the patient. The patient was provided an opportunity to ask questions and all were answered. The patient agreed with the plan and demonstrated an understanding of the instructions.   The patient was advised to call back or seek an in-person evaluation if the symptoms worsen or if the condition fails to improve as anticipated.   I provided 21+ minutes of time during this encounter.    Follow up plan: Return in about 6 weeks (around 08/18/2022) for WEIGHT CHECK.

## 2022-07-07 NOTE — Progress Notes (Signed)
Pt scheduled  

## 2022-07-07 NOTE — Assessment & Plan Note (Signed)
BMI 32.07, is losing slightly.  No family history of thyroid cancer (MTC, MEN 2, thyroid cell tumors) or pancreatitis.  Continue Wegovy at 1 MG at this time, could trial higher dose in future, however did not tolerate in past.  Recommended eating smaller high protein, low fat meals more frequently and exercising 30 mins a day 5 times a week with a goal of 10-15lb weight loss in the next 3 months. Patient voiced their understanding and motivation to adhere to these recommendations. Plan on return in 6 weeks virtual.

## 2022-07-14 ENCOUNTER — Ambulatory Visit: Payer: No Typology Code available for payment source | Admitting: Dermatology

## 2022-07-22 ENCOUNTER — Other Ambulatory Visit: Payer: Self-pay | Admitting: Nurse Practitioner

## 2022-07-22 NOTE — Telephone Encounter (Signed)
Pt said the birth control is not the Junel it is the Loestrin.

## 2022-07-22 NOTE — Telephone Encounter (Signed)
Medication Refill - Medication: Junel birth control  Has the patient contacted their pharmacy? No.  She had to change pharmacy because on insurance (Agent: If no, request that the patient contact the pharmacy for the refill. If patient does not wish to contact the pharmacy document the reason why and proceed with request.) (Agent: If yes, when and what did the pharmacy advise?)  Preferred Pharmacy (with phone number or street name): [facility] [address]  Has the patient been seen for an appointment in the last year OR does the patient have an upcoming appointment? Yes.    Agent: Please be advised that RX refills may take up to 3 business days. We ask that you follow-up with your pharmacy.

## 2022-07-22 NOTE — Telephone Encounter (Signed)
[facility] called and spoke to [place], Education administrator about the patient wanting Loestrin refilled [facility], [facility] She says they will need a new Rx because they are not [facility].

## 2022-07-23 NOTE — Telephone Encounter (Signed)
Unable to refill per protocol, Rx request is too soon. Last refill 04/10/22 for 84 and  RF. Will refuse.  Requested Prescriptions  Pending Prescriptions Disp Refills   norethindrone-ethinyl estradiol (LOESTRIN) 1-20 MG-MCG tablet 84 tablet 4    Sig: Take 1 tablet by mouth daily.     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

## 2022-08-03 NOTE — Patient Instructions (Signed)

## 2022-08-07 ENCOUNTER — Encounter: Payer: Self-pay | Admitting: Nurse Practitioner

## 2022-08-07 ENCOUNTER — Telehealth: Payer: Self-pay

## 2022-08-07 ENCOUNTER — Telehealth (INDEPENDENT_AMBULATORY_CARE_PROVIDER_SITE_OTHER): Payer: No Typology Code available for payment source | Admitting: Nurse Practitioner

## 2022-08-07 VITALS — Wt 199.0 lb

## 2022-08-07 DIAGNOSIS — Z6831 Body mass index (BMI) 31.0-31.9, adult: Secondary | ICD-10-CM | POA: Diagnosis not present

## 2022-08-07 DIAGNOSIS — E6609 Other obesity due to excess calories: Secondary | ICD-10-CM

## 2022-08-07 MED ORDER — TOPIRAMATE 25 MG PO TABS: ORAL_TABLET | ORAL | 4 refills | Status: DC

## 2022-08-07 NOTE — Assessment & Plan Note (Signed)
BMI 31.16, is losing weight.  Wegovy no longer covered by insurance, but she reports Topamax is with a PA.  Will trial Topamax 25 MG daily for one week and then if tolerating increase to 50 MG daily.  Educated her at length on use of medication and side effects.  Recommend condom use as may reduce affect of BCP + minimal alcohol use.  Recommended eating smaller high protein, low fat meals more frequently and exercising 30 mins a day 5 times a week with a goal of 10-15lb weight loss in the next 3 months. Patient voiced their understanding and motivation to adhere to these recommendations. Plan on return in 6 weeks virtual.

## 2022-08-07 NOTE — Telephone Encounter (Signed)
PA started for Toprimate through [employer] meds. Awaiting on determination

## 2022-08-07 NOTE — Progress Notes (Signed)
Wt 199 lb (90.3 kg)   BMI 31.16 kg/m    Subjective:    Patient ID: Jessica Melendez, female    DOB: [birth year], 31 y.o.   MRN: [id number]  HPI: Jessica Melendez is a 31 y.o. female  Chief Complaint  Patient presents with   Weight Management Screening    Would like something new for Weight Management.    This visit was completed via video visit through MyChart due to the restrictions of the COVID-19 pandemic. All issues as above were discussed and addressed. Physical exam was done as above through visual confirmation on video through MyChart. If it was felt that the patient should be evaluated in the office, they were directed there. The patient verbally consented to this visit. Location of the patient: home Location of the provider: work Those involved with this call:  Provider: Marnee Guarneri, DNP CMA: Frazier Butt, [place] Desk/Registration: [employer]  Time spent on call:  21 minutes with patient face to face via video conference. More than 50% of this time was spent in counseling and coordination of care. 15 minutes total spent in review of patient's record and preparation of their chart.  I verified patient identity using two factors (patient name and date of birth). Patient consents verbally to being seen via telemedicine visit today.    WEIGHT LOSS DISCUSSION: Follow-up for weight.  Started Wegovy on 04/10/22.  Was on 1 MG dosing, but insurance no longer covering for weight loss.  They will not cover Zepbound.  They will cover Topamax.  She was tolerating Wegovy well, with weight loss presenting  She does notice less appetite with 1 MG, has been on this dose for 2 weeks.  No other weight loss medications tried.  Her goal is to get to 180 lbs. Over past 6 months:  Walking 2 miles 3-4 times a week + diet wise has cut our sodas and cut back on sugars + fried foods -- eating smaller portions -- intermittent fasting. Her initial weight was 205 lbs and currently she is at 199 lbs.  She  would like to trial Topamax.  Relevant past medical, surgical, family and social history reviewed and updated as indicated. Interim medical history since our last visit reviewed. Allergies and medications reviewed and updated.  Review of Systems  Constitutional:  Negative for activity change, appetite change, diaphoresis, fatigue and fever.  Respiratory:  Negative for cough, chest tightness and shortness of breath.   Cardiovascular:  Negative for chest pain, palpitations and leg swelling.  Gastrointestinal: Negative.   Neurological: Negative.   Psychiatric/Behavioral: Negative.      Per HPI unless specifically indicated above     Objective:    Wt 199 lb (90.3 kg)   BMI 31.16 kg/m   Wt Readings from Last 3 Encounters:  08/07/22 199 lb (90.3 kg)  07/07/22 204 lb 12.8 oz (92.9 kg)  05/08/22 203 lb 9.6 oz (92.4 kg)    Physical Exam Vitals and nursing note reviewed.  Constitutional:      General: She is awake. She is not in acute distress.    Appearance: She is well-developed. She is obese. She is not ill-appearing or toxic-appearing.  HENT:     Head: Normocephalic.     Right Ear: Hearing normal.     Left Ear: Hearing normal.  Eyes:     General: Lids are normal.        Right eye: No discharge.        Left eye: No  discharge.     Conjunctiva/sclera: Conjunctivae normal.  Pulmonary:     Effort: Pulmonary effort is normal. No accessory muscle usage or respiratory distress.  Musculoskeletal:     Cervical back: Normal range of motion.  Neurological:     Mental Status: She is alert and oriented to person, place, and time.  Psychiatric:        Attention and Perception: Attention normal.        Mood and Affect: Mood normal.        Behavior: Behavior normal. Behavior is cooperative.        Thought Content: Thought content normal.        Judgment: Judgment normal.     Results for orders placed or performed in visit on 05/08/22  CBC with Differential/Platelet  Result Value  Ref Range   WBC 7.6 3.4 - 10.8 x10E3/uL   RBC 4.70 3.77 - 5.28 x10E6/uL   Hemoglobin 14.9 11.1 - 15.9 g/dL   Hematocrit 44.0 34.0 - 46.6 %   MCV 94 79 - 97 fL   MCH 31.7 26.6 - 33.0 pg   MCHC 33.9 31.5 - 35.7 g/dL   RDW 11.3 (L) 11.7 - 15.4 %   Platelets 353 150 - 450 x10E3/uL   Neutrophils 55 Not Estab. %   Lymphs 36 Not Estab. %   Monocytes 8 Not Estab. %   Eos 1 Not Estab. %   Basos 0 Not Estab. %   Neutrophils Absolute 4.2 1.4 - 7.0 x10E3/uL   Lymphocytes Absolute 2.7 0.7 - 3.1 x10E3/uL   Monocytes Absolute 0.6 0.1 - 0.9 x10E3/uL   EOS (ABSOLUTE) 0.1 0.0 - 0.4 x10E3/uL   Basophils Absolute 0.0 0.0 - 0.2 x10E3/uL   Immature Granulocytes 0 Not Estab. %   Immature Grans (Abs) 0.0 0.0 - 0.1 x10E3/uL  Comprehensive metabolic panel  Result Value Ref Range   Glucose 85 70 - 99 mg/dL   BUN 9 6 - 20 mg/dL   Creatinine, Ser 0.99 0.57 - 1.00 mg/dL   eGFR 79 >59 mL/min/1.73   BUN/Creatinine Ratio 9 9 - 23   Sodium 140 134 - 144 mmol/L   Potassium 3.8 3.5 - 5.2 mmol/L   Chloride 102 96 - 106 mmol/L   CO2 23 20 - 29 mmol/L   Calcium 9.4 8.7 - 10.2 mg/dL   Total Protein 7.6 6.0 - 8.5 g/dL   Albumin 4.4 4.0 - 5.0 g/dL   Globulin, Total 3.2 1.5 - 4.5 g/dL   Albumin/Globulin Ratio 1.4 1.2 - 2.2   Bilirubin Total 0.3 0.0 - 1.2 mg/dL   Alkaline Phosphatase 62 44 - 121 IU/L   AST 21 0 - 40 IU/L   ALT 43 (H) 0 - 32 IU/L  Lipid Panel w/o Chol/HDL Ratio  Result Value Ref Range   Cholesterol, Total 162 100 - 199 mg/dL   Triglycerides 76 0 - 149 mg/dL   HDL 48 >39 mg/dL   VLDL Cholesterol Cal 15 5 - 40 mg/dL   LDL Chol Calc (NIH) 99 0 - 99 mg/dL  TSH  Result Value Ref Range   TSH 0.486 0.450 - 4.500 uIU/mL  VITAMIN D 25 Hydroxy (Vit-D Deficiency, Fractures)  Result Value Ref Range   Vit D, 25-Hydroxy 31.3 30.0 - 100.0 ng/mL  HIV Antibody (routine testing w rflx)  Result Value Ref Range   HIV Screen 4th Generation wRfx Non Reactive Non Reactive  Pregnancy, urine  Result Value Ref  Range   Preg Test, Ur Negative  Negative  Cytology - PAP  Result Value Ref Range   Adequacy      Satisfactory for evaluation; transformation zone component PRESENT.   Diagnosis      - Negative for intraepithelial lesion or malignancy (NILM)      Assessment & Plan:   Problem List Items Addressed This Visit       Other   Obesity - Primary    BMI 31.16, is losing weight.  Wegovy no longer covered by insurance, but she reports Topamax is with a PA.  Will trial Topamax 25 MG daily for one week and then if tolerating increase to 50 MG daily.  Educated her at length on use of medication and side effects.  Recommend condom use as may reduce affect of BCP + minimal alcohol use.  Recommended eating smaller high protein, low fat meals more frequently and exercising 30 mins a day 5 times a week with a goal of 10-15lb weight loss in the next 3 months. Patient voiced their understanding and motivation to adhere to these recommendations. Plan on return in 6 weeks virtual.       I discussed the assessment and treatment plan with the patient. The patient was provided an opportunity to ask questions and all were answered. The patient agreed with the plan and demonstrated an understanding of the instructions.   The patient was advised to call back or seek an in-person evaluation if the symptoms worsen or if the condition fails to improve as anticipated.   I provided 21+ minutes of time during this encounter.    Follow up plan: Return in about 6 weeks (around 09/18/2022) for WEIGHT CHECK -- virtual or in house.

## 2022-08-13 NOTE — Telephone Encounter (Signed)
Pt would like to know if you have heard anything about the PA for topiramate (TOPAMAX) 25 MG tablet

## 2022-08-26 ENCOUNTER — Telehealth: Payer: 59 | Admitting: Nurse Practitioner

## 2022-09-20 NOTE — Patient Instructions (Signed)

## 2022-09-24 ENCOUNTER — Encounter: Payer: Self-pay | Admitting: Nurse Practitioner

## 2022-09-24 ENCOUNTER — Telehealth (INDEPENDENT_AMBULATORY_CARE_PROVIDER_SITE_OTHER): Payer: No Typology Code available for payment source | Admitting: Nurse Practitioner

## 2022-09-24 VITALS — Wt 197.0 lb

## 2022-09-24 DIAGNOSIS — E6609 Other obesity due to excess calories: Secondary | ICD-10-CM

## 2022-09-24 DIAGNOSIS — Z6830 Body mass index (BMI) 30.0-30.9, adult: Secondary | ICD-10-CM

## 2022-09-24 DIAGNOSIS — E669 Obesity, unspecified: Secondary | ICD-10-CM | POA: Diagnosis not present

## 2022-09-24 MED ORDER — TOPIRAMATE 50 MG PO TABS: 75.0000 mg | ORAL_TABLET | Freq: Every day | ORAL | 4 refills | Status: DC

## 2022-09-24 NOTE — Progress Notes (Signed)
Wt 197 lb (89.4 kg)   BMI 30.85 kg/m    Subjective:    Patient ID: Jessica Melendez, female    DOB: 04/04/1991, 32 y.o.   MRN: [id number]  HPI: Jessica Melendez is a 32 y.o. female  Chief Complaint  Patient presents with   Weight Check   This visit was completed via video visit through [place] due to the restrictions of the COVID-19 pandemic. All issues as above were discussed and addressed. Physical exam was done as above through visual confirmation on video through MyChart. If it was felt that the patient should be evaluated in the office, they were directed there. The patient verbally consented to this visit. Location of the patient: home Location of the provider: work Those involved with this call:  Provider: Marnee Guarneri, DNP CMA: Frazier Butt, [place] Desk/Registration: [employer]  Time spent on call:  21 minutes with patient face to face via video conference. More than 50% of this time was spent in counseling and coordination of care. 15 minutes total spent in review of patient's record and preparation of their chart.  I verified patient identity using two factors (patient name and date of birth). Patient consents verbally to being seen via telemedicine visit today.    WEIGHT LOSS DISCUSSION: Follow-up for weight.  Started Wegovy on 04/10/22.  Was on 1 MG dosing, but insurance no longer covered for weight loss.  They will not cover Zepbound.  She is taking Topamax and tolerating well with exception of metallic taste in mouth -- appetite is somewhat reduced with this -- taking 50 MG daily.  She was tolerating Wegovy well, with weight loss presenting  No other weight loss medications tried.  Her goal is to get to 180 lbs. Over past 6 months: Walking 2 miles 3-4 times a week + continues to work on diet, not tolerating greasy foods.  Eating smaller portions -- intermittent fasting. Her initial weight was 205 lbs and currently she is at 197 lbs.    Relevant past medical, surgical,  family and social history reviewed and updated as indicated. Interim medical history since our last visit reviewed. Allergies and medications reviewed and updated.  Review of Systems  Constitutional:  Negative for activity change, appetite change, diaphoresis, fatigue and fever.  Respiratory:  Negative for cough, chest tightness and shortness of breath.   Cardiovascular:  Negative for chest pain, palpitations and leg swelling.  Gastrointestinal: Negative.   Neurological: Negative.   Psychiatric/Behavioral: Negative.      Per HPI unless specifically indicated above     Objective:    Wt 197 lb (89.4 kg)   BMI 30.85 kg/m   Wt Readings from Last 3 Encounters:  09/24/22 197 lb (89.4 kg)  08/07/22 199 lb (90.3 kg)  07/07/22 204 lb 12.8 oz (92.9 kg)    Physical Exam Vitals and nursing note reviewed.  Constitutional:      General: She is awake. She is not in acute distress.    Appearance: She is well-developed. She is obese. She is not ill-appearing or toxic-appearing.  HENT:     Head: Normocephalic.     Right Ear: Hearing normal.     Left Ear: Hearing normal.  Eyes:     General: Lids are normal.        Right eye: No discharge.        Left eye: No discharge.     Conjunctiva/sclera: Conjunctivae normal.  Pulmonary:     Effort: Pulmonary effort is normal. No  accessory muscle usage or respiratory distress.  Musculoskeletal:     Cervical back: Normal range of motion.  Neurological:     Mental Status: She is alert and oriented to person, place, and time.  Psychiatric:        Attention and Perception: Attention normal.        Mood and Affect: Mood normal.        Behavior: Behavior normal. Behavior is cooperative.        Thought Content: Thought content normal.        Judgment: Judgment normal.     Results for orders placed or performed in visit on 05/08/22  CBC with Differential/Platelet  Result Value Ref Range   WBC 7.6 3.4 - 10.8 x10E3/uL   RBC 4.70 3.77 - 5.28 x10E6/uL    Hemoglobin 14.9 11.1 - 15.9 g/dL   Hematocrit 44.0 34.0 - 46.6 %   MCV 94 79 - 97 fL   MCH 31.7 26.6 - 33.0 pg   MCHC 33.9 31.5 - 35.7 g/dL   RDW 11.3 (L) 11.7 - 15.4 %   Platelets 353 150 - 450 x10E3/uL   Neutrophils 55 Not Estab. %   Lymphs 36 Not Estab. %   Monocytes 8 Not Estab. %   Eos 1 Not Estab. %   Basos 0 Not Estab. %   Neutrophils Absolute 4.2 1.4 - 7.0 x10E3/uL   Lymphocytes Absolute 2.7 0.7 - 3.1 x10E3/uL   Monocytes Absolute 0.6 0.1 - 0.9 x10E3/uL   EOS (ABSOLUTE) 0.1 0.0 - 0.4 x10E3/uL   Basophils Absolute 0.0 0.0 - 0.2 x10E3/uL   Immature Granulocytes 0 Not Estab. %   Immature Grans (Abs) 0.0 0.0 - 0.1 x10E3/uL  Comprehensive metabolic panel  Result Value Ref Range   Glucose 85 70 - 99 mg/dL   BUN 9 6 - 20 mg/dL   Creatinine, Ser 0.99 0.57 - 1.00 mg/dL   eGFR 79 >59 mL/min/1.73   BUN/Creatinine Ratio 9 9 - 23   Sodium 140 134 - 144 mmol/L   Potassium 3.8 3.5 - 5.2 mmol/L   Chloride 102 96 - 106 mmol/L   CO2 23 20 - 29 mmol/L   Calcium 9.4 8.7 - 10.2 mg/dL   Total Protein 7.6 6.0 - 8.5 g/dL   Albumin 4.4 4.0 - 5.0 g/dL   Globulin, Total 3.2 1.5 - 4.5 g/dL   Albumin/Globulin Ratio 1.4 1.2 - 2.2   Bilirubin Total 0.3 0.0 - 1.2 mg/dL   Alkaline Phosphatase 62 44 - 121 IU/L   AST 21 0 - 40 IU/L   ALT 43 (H) 0 - 32 IU/L  Lipid Panel w/o Chol/HDL Ratio  Result Value Ref Range   Cholesterol, Total 162 100 - 199 mg/dL   Triglycerides 76 0 - 149 mg/dL   HDL 48 >39 mg/dL   VLDL Cholesterol Cal 15 5 - 40 mg/dL   LDL Chol Calc (NIH) 99 0 - 99 mg/dL  TSH  Result Value Ref Range   TSH 0.486 0.450 - 4.500 uIU/mL  VITAMIN D 25 Hydroxy (Vit-D Deficiency, Fractures)  Result Value Ref Range   Vit D, 25-Hydroxy 31.3 30.0 - 100.0 ng/mL  HIV Antibody (routine testing w rflx)  Result Value Ref Range   HIV Screen 4th Generation wRfx Non Reactive Non Reactive  Pregnancy, urine  Result Value Ref Range   Preg Test, Ur Negative Negative  Cytology - PAP  Result Value  Ref Range   Adequacy      Satisfactory for  evaluation; transformation zone component PRESENT.   Diagnosis      - Negative for intraepithelial lesion or malignancy (NILM)      Assessment & Plan:   Problem List Items Addressed This Visit       Other   Obesity - Primary    BMI 30.85, continues to lose weight.  Wegovy no longer covered by insurance.  Will continue Topamax as is offering weight loss benefit and increase to 75 MG daily.  Educated her at length on use of medication and side effects.  Recommend condom use as may reduce affect of BCP + minimal alcohol use.  Recommended eating smaller high protein, low fat meals more frequently and exercising 30 mins a day 5 times a week with a goal of 10-15lb weight loss in the next 3 months. Patient voiced their understanding and motivation to adhere to these recommendations. Plan on return in 6 weeks virtual.       I discussed the assessment and treatment plan with the patient. The patient was provided an opportunity to ask questions and all were answered. The patient agreed with the plan and demonstrated an understanding of the instructions.   The patient was advised to call back or seek an in-person evaluation if the symptoms worsen or if the condition fails to improve as anticipated.   I provided 21+ minutes of time during this encounter.    Follow up plan: Return in about 6 weeks (around 11/05/2022) for WEIGHT CHECK -- increased Topamax to 75 MG.

## 2022-09-24 NOTE — Assessment & Plan Note (Signed)
BMI 30.85, continues to lose weight.  Wegovy no longer covered by insurance.  Will continue Topamax as is offering weight loss benefit and increase to 75 MG daily.  Educated her at length on use of medication and side effects.  Recommend condom use as may reduce affect of BCP + minimal alcohol use.  Recommended eating smaller high protein, low fat meals more frequently and exercising 30 mins a day 5 times a week with a goal of 10-15lb weight loss in the next 3 months. Patient voiced their understanding and motivation to adhere to these recommendations. Plan on return in 6 weeks virtual.

## 2022-11-02 NOTE — Patient Instructions (Signed)

## 2022-11-05 ENCOUNTER — Encounter: Payer: Self-pay | Admitting: Nurse Practitioner

## 2022-11-05 ENCOUNTER — Telehealth (INDEPENDENT_AMBULATORY_CARE_PROVIDER_SITE_OTHER): Payer: No Typology Code available for payment source | Admitting: Nurse Practitioner

## 2022-11-05 VITALS — Wt 197.6 lb

## 2022-11-05 DIAGNOSIS — E6609 Other obesity due to excess calories: Secondary | ICD-10-CM

## 2022-11-05 DIAGNOSIS — Z6830 Body mass index (BMI) 30.0-30.9, adult: Secondary | ICD-10-CM

## 2022-11-05 NOTE — Progress Notes (Signed)
Wt 197 lb 9.6 oz (89.6 kg)   BMI 30.94 kg/m    Subjective:    Patient ID: Jessica Melendez, female    DOB: October 22, 1990, 32 y.o.   MRN: [id number]  HPI: Jessica Melendez is a 32 y.o. female  Chief Complaint  Patient presents with   Medication Refill    Weight loss medication   This visit was completed via video visit through MyChart due to the restrictions of the COVID-19 pandemic. All issues as above were discussed and addressed. Physical exam was done as above through visual confirmation on video through MyChart. If it was felt that the patient should be evaluated in the office, they were directed there. The patient verbally consented to this visit. Location of the patient: home Location of the provider: work Those involved with this call:  Provider: Marnee Guarneri, DNP CMA: Frazier Butt, [place] Desk/Registration: [employer]  Time spent on call:  21 minutes with patient face to face via video conference. More than 50% of this time was spent in counseling and coordination of care. 15 minutes total spent in review of patient's record and preparation of their chart.  I verified patient identity using two factors (patient name and date of birth). Patient consents verbally to being seen via telemedicine visit today.    WEIGHT LOSS DISCUSSION: Follow-up for weight.  Started Wegovy on 04/10/22.  Was on 1 MG dosing with benefit, but insurance stopped covering for weight loss.  They will not cover Zepbound.  She is taking Topamax 75 MG, increased last visit, but does not like this as is making more irritable and anxious + libido issues.  She was tolerating Wegovy well, with weight loss presenting  No other weight loss medications tried.  Her goal is to get to 180 lbs. Over past 6 months: Walking 2 miles 3-4 times a week + continues to work on diet.  She is getting closer to goal.  Eating smaller portions -- intermittent fasting. Her initial weight was 205 lbs and currently she is at 197 lbs.        11/05/2022    1:58 PM 09/24/2022    9:16 AM 08/07/2022   10:51 AM 07/07/2022    8:11 AM 05/08/2022    9:08 AM  Depression screen PHQ 2/9  Decreased Interest 0 0 0 0 1  Down, Depressed, Hopeless 0 0 0 0 0  PHQ - 2 Score 0 0 0 0 1  Altered sleeping 0 0 0 0 0  Tired, decreased energy 0 0 0 0 1  Change in appetite 0 0 0 0 0  Feeling bad or failure about yourself  0 0 0 0 0  Trouble concentrating 0 0 0 0 0  Moving slowly or fidgety/restless 0 0 0 0 0  Suicidal thoughts 0 0 0 0 0  PHQ-9 Score 0 0 0 0 2  Difficult doing work/chores  Not difficult at all  Not difficult at all        11/05/2022    1:58 PM 09/24/2022    9:16 AM 08/07/2022   10:51 AM 07/07/2022    8:11 AM  GAD 7 : Generalized Anxiety Score  Nervous, Anxious, on Edge 2 0 0 0  Control/stop worrying 0 0 0 0  Worry too much - different things 0 0 0 0  Trouble relaxing 0 0 0 0  Restless 2 0 0 0  Easily annoyed or irritable 2 0 0 0  Afraid - awful might happen 0  0 0 0  Total GAD 7 Score 6 0 0 0  Anxiety Difficulty Not difficult at all Not difficult at all Not difficult at all Not difficult at all   Relevant past medical, surgical, family and social history reviewed and updated as indicated. Interim medical history since our last visit reviewed. Allergies and medications reviewed and updated.  Review of Systems  Constitutional:  Negative for activity change, appetite change, diaphoresis, fatigue and fever.  Respiratory:  Negative for cough, chest tightness and shortness of breath.   Cardiovascular:  Negative for chest pain, palpitations and leg swelling.  Gastrointestinal: Negative.   Neurological: Negative.   Psychiatric/Behavioral: Negative.      Per HPI unless specifically indicated above     Objective:    Wt 197 lb 9.6 oz (89.6 kg)   BMI 30.94 kg/m   Wt Readings from Last 3 Encounters:  11/05/22 197 lb 9.6 oz (89.6 kg)  09/24/22 197 lb (89.4 kg)  08/07/22 199 lb (90.3 kg)    Physical Exam Vitals  and nursing note reviewed.  Constitutional:      General: She is awake. She is not in acute distress.    Appearance: She is well-developed. She is obese. She is not ill-appearing or toxic-appearing.  HENT:     Head: Normocephalic.     Right Ear: Hearing normal.     Left Ear: Hearing normal.  Eyes:     General: Lids are normal.        Right eye: No discharge.        Left eye: No discharge.     Conjunctiva/sclera: Conjunctivae normal.  Pulmonary:     Effort: Pulmonary effort is normal. No accessory muscle usage or respiratory distress.  Musculoskeletal:     Cervical back: Normal range of motion.  Neurological:     Mental Status: She is alert and oriented to person, place, and time.  Psychiatric:        Attention and Perception: Attention normal.        Mood and Affect: Mood normal.        Behavior: Behavior normal. Behavior is cooperative.        Thought Content: Thought content normal.        Judgment: Judgment normal.     Results for orders placed or performed in visit on 05/08/22  CBC with Differential/Platelet  Result Value Ref Range   WBC 7.6 3.4 - 10.8 x10E3/uL   RBC 4.70 3.77 - 5.28 x10E6/uL   Hemoglobin 14.9 11.1 - 15.9 g/dL   Hematocrit 44.0 34.0 - 46.6 %   MCV 94 79 - 97 fL   MCH 31.7 26.6 - 33.0 pg   MCHC 33.9 31.5 - 35.7 g/dL   RDW 11.3 (L) 11.7 - 15.4 %   Platelets 353 150 - 450 x10E3/uL   Neutrophils 55 Not Estab. %   Lymphs 36 Not Estab. %   Monocytes 8 Not Estab. %   Eos 1 Not Estab. %   Basos 0 Not Estab. %   Neutrophils Absolute 4.2 1.4 - 7.0 x10E3/uL   Lymphocytes Absolute 2.7 0.7 - 3.1 x10E3/uL   Monocytes Absolute 0.6 0.1 - 0.9 x10E3/uL   EOS (ABSOLUTE) 0.1 0.0 - 0.4 x10E3/uL   Basophils Absolute 0.0 0.0 - 0.2 x10E3/uL   Immature Granulocytes 0 Not Estab. %   Immature Grans (Abs) 0.0 0.0 - 0.1 x10E3/uL  Comprehensive metabolic panel  Result Value Ref Range   Glucose 85 70 - 99 mg/dL  BUN 9 6 - 20 mg/dL   Creatinine, Ser 0.99 0.57 - 1.00  mg/dL   eGFR 79 >59 mL/min/1.73   BUN/Creatinine Ratio 9 9 - 23   Sodium 140 134 - 144 mmol/L   Potassium 3.8 3.5 - 5.2 mmol/L   Chloride 102 96 - 106 mmol/L   CO2 23 20 - 29 mmol/L   Calcium 9.4 8.7 - 10.2 mg/dL   Total Protein 7.6 6.0 - 8.5 g/dL   Albumin 4.4 4.0 - 5.0 g/dL   Globulin, Total 3.2 1.5 - 4.5 g/dL   Albumin/Globulin Ratio 1.4 1.2 - 2.2   Bilirubin Total 0.3 0.0 - 1.2 mg/dL   Alkaline Phosphatase 62 44 - 121 IU/L   AST 21 0 - 40 IU/L   ALT 43 (H) 0 - 32 IU/L  Lipid Panel w/o Chol/HDL Ratio  Result Value Ref Range   Cholesterol, Total 162 100 - 199 mg/dL   Triglycerides 76 0 - 149 mg/dL   HDL 48 >39 mg/dL   VLDL Cholesterol Cal 15 5 - 40 mg/dL   LDL Chol Calc (NIH) 99 0 - 99 mg/dL  TSH  Result Value Ref Range   TSH 0.486 0.450 - 4.500 uIU/mL  VITAMIN D 25 Hydroxy (Vit-D Deficiency, Fractures)  Result Value Ref Range   Vit D, 25-Hydroxy 31.3 30.0 - 100.0 ng/mL  HIV Antibody (routine testing w rflx)  Result Value Ref Range   HIV Screen 4th Generation wRfx Non Reactive Non Reactive  Pregnancy, urine  Result Value Ref Range   Preg Test, Ur Negative Negative  Cytology - PAP  Result Value Ref Range   Adequacy      Satisfactory for evaluation; transformation zone component PRESENT.   Diagnosis      - Negative for intraepithelial lesion or malignancy (NILM)      Assessment & Plan:   Problem List Items Addressed This Visit       Other   Obesity - Primary    BMI 30.94, maintaining.  Wegovy no longer covered by insurance.  Will stop Topamax due to side effects.  Educated her at length on use of medication and side effects.  Recommended eating smaller high protein, low fat meals more frequently and exercising 30 mins a day 5 times a week with a goal of 10-15lb weight loss in the next 3 months. Patient voiced their understanding and motivation to adhere to these recommendations.  Referral to weight management provider placed.      Relevant Orders   Amb Ref to  Medical Weight Management    I discussed the assessment and treatment plan with the patient. The patient was provided an opportunity to ask questions and all were answered. The patient agreed with the plan and demonstrated an understanding of the instructions.   The patient was advised to call back or seek an in-person evaluation if the symptoms worsen or if the condition fails to improve as anticipated.   I provided 21+ minutes of time during this encounter.    Follow up plan: Return in about 6 months (around 05/11/2023) for Annual physical after 05/09/23.

## 2022-11-05 NOTE — Assessment & Plan Note (Signed)
BMI 30.94, maintaining.  Wegovy no longer covered by insurance.  Will stop Topamax due to side effects.  Educated her at length on use of medication and side effects.  Recommended eating smaller high protein, low fat meals more frequently and exercising 30 mins a day 5 times a week with a goal of 10-15lb weight loss in the next 3 months. Patient voiced their understanding and motivation to adhere to these recommendations.  Referral to weight management provider placed.

## 2022-11-06 NOTE — Progress Notes (Signed)
Appointment has been scheduled.

## 2022-11-21 ENCOUNTER — Encounter: Payer: Self-pay | Admitting: Nurse Practitioner

## 2022-11-21 DIAGNOSIS — Z6830 Body mass index (BMI) 30.0-30.9, adult: Secondary | ICD-10-CM

## 2022-12-03 ENCOUNTER — Telehealth: Payer: Self-pay | Admitting: Nurse Practitioner

## 2022-12-03 NOTE — Telephone Encounter (Signed)
Copied from CRM [id number]. Topic: Referral - Status >> Dec 03, 2022  4:23 PM Carrielelia G wrote: Reason for CRM: patient would like to know the status of her weight management referral

## 2022-12-08 NOTE — Telephone Encounter (Signed)
Copied from CRM [id number]. Topic: Referral - Status >> Dec 08, 2022 10:49 AM Macon Large wrote: Reason for CRM: Patient called once again stating she would like to know the status of the weight management referral. Cb# [phone] >> Dec 08, 2022  1:41 PM Carrielelia G wrote: Pt calling back, stated that  she called the weight management facility and they stated they do not have the referral.  PLease advise

## 2022-12-08 NOTE — Telephone Encounter (Signed)
The patient called back in again to check on the status of her weight management referral stating she spoke with the facility and they told her they have not received any referrals. She has been referred to Sharen Heck, PA in [place]. Please assist patient further

## 2022-12-09 NOTE — Telephone Encounter (Signed)
Returned patient call and gave her number for [facility] [phone] at [address] [place]. Patient verbalized understanding and will call for appt.

## 2022-12-10 ENCOUNTER — Telehealth: Payer: Self-pay | Admitting: Nurse Practitioner

## 2022-12-10 NOTE — Telephone Encounter (Signed)
Copied from CRM [id number]. Topic: Referral - Status >> Dec 09, 2022  4:31 PM Everette C wrote: Reason for CRM: Madison with [facility] has called to follow up on a request for a referral for the patient to be seen   Referral information can be resubmitted to [phone]  Please contact further when possible

## 2023-05-13 ENCOUNTER — Encounter: Payer: No Typology Code available for payment source | Admitting: Nurse Practitioner

## 2023-05-13 DIAGNOSIS — Z13220 Encounter for screening for lipoid disorders: Secondary | ICD-10-CM

## 2023-05-13 DIAGNOSIS — Z3041 Encounter for surveillance of contraceptive pills: Secondary | ICD-10-CM

## 2023-05-13 DIAGNOSIS — R7401 Elevation of levels of liver transaminase levels: Secondary | ICD-10-CM

## 2023-05-13 DIAGNOSIS — Z0000 Encounter for general adult medical examination without abnormal findings: Secondary | ICD-10-CM

## 2023-05-13 DIAGNOSIS — L2084 Intrinsic (allergic) eczema: Secondary | ICD-10-CM

## 2023-05-13 DIAGNOSIS — E6609 Other obesity due to excess calories: Secondary | ICD-10-CM

## 2023-05-27 ENCOUNTER — Encounter: Payer: No Typology Code available for payment source | Admitting: Nurse Practitioner

## 2023-06-07 NOTE — Patient Instructions (Signed)
Be Involved in Caring For Your Health:  Taking Medications When medications are taken as directed, they can greatly improve your health. But if they are not taken as prescribed, they may not work. In some cases, not taking them correctly can be harmful. To help ensure your treatment remains effective and safe, understand your medications and how to take them. Bring your medications to each visit for review by your provider.  Your lab results, notes, and after visit summary will be available on My Chart. We strongly encourage you to use this feature. If lab results are abnormal the clinic will contact you with the appropriate steps. If the clinic does not contact you assume the results are satisfactory. You can always view your results on My Chart. If you have questions regarding your health or results, please contact the clinic during office hours. You can also ask questions on My Chart.  We at [facility] are grateful that you chose us to provide your care. We strive to provide evidence-based and compassionate care and are always looking for feedback. If you get a survey from the clinic please complete this so we can hear your opinions.  Managing Anxiety, Adult After being diagnosed with anxiety, you may be relieved to know why you have felt or behaved a certain way. You may also feel overwhelmed about the treatment ahead and what it will mean for your life. With care and support, you can manage your anxiety. How to manage lifestyle changes Understanding the difference between stress and anxiety Although stress can play a role in anxiety, it is not the same as anxiety. Stress is your body's reaction to life changes and events, both good and bad. Stress is often caused by something external, such as a deadline, test, or competition. It normally goes away after the event has ended and will last just a few hours. But, stress can be ongoing and can lead to more than just stress. Anxiety is  caused by something internal, such as imagining a terrible outcome or worrying that something will go wrong that will greatly upset you. Anxiety often does not go away even after the event is over, and it can become a long-term (chronic) worry. Lowering stress and anxiety Talk with your health care provider or a counselor to learn more about lowering anxiety and stress. They may suggest tension-reduction techniques, such as: Music. Spend time creating or listening to music that you enjoy and that inspires you. Mindfulness-based meditation. Practice being aware of your normal breaths while not trying to control your breathing. It can be done while sitting or walking. Centering prayer. Focus on a word, phrase, or sacred image that means something to you and brings you peace. Deep breathing. Expand your stomach and inhale slowly through your nose. Hold your breath for 3-5 seconds. Then breathe out slowly, letting your stomach muscles relax. Self-talk. Learn to notice and spot thought patterns that lead to anxiety reactions. Change those patterns to thoughts that feel peaceful. Muscle relaxation. Take time to tense muscles and then relax them. Choose a tension-reduction technique that fits your lifestyle and personality. These techniques take time and practice. Set aside 5-15 minutes a day to do them. Specialized therapists can offer counseling and training in these techniques. The training to help with anxiety may be covered by some insurance plans. Other things you can do to manage stress and anxiety include: Keeping a stress diary. This can help you learn what triggers your reaction and then learn ways   to manage your response. Thinking about how you react to certain situations. You may not be able to control everything, but you can control your response. Making time for activities that help you relax and not feeling guilty about spending your time in this way. Doing visual imagery. This involves  imagining or creating mental pictures to help you relax. Practicing yoga. Through yoga poses, you can lower tension and relax.  Medicines Medicines for anxiety include: Antidepressant medicines. These are usually prescribed for long-term daily control. Anti-anxiety medicines. These may be added in severe cases, especially when panic attacks occur. When used together, medicines, psychotherapy, and tension-reduction techniques may be the most effective treatment. Relationships Relationships can play a big part in helping you recover. Spend more time connecting with trusted friends and family members. Think about going to couples counseling if you have a partner, taking family education classes, or going to family therapy. Therapy can help you and others better understand your anxiety. How to recognize changes in your anxiety Everyone responds differently to treatment for anxiety. Recovery from anxiety happens when symptoms lessen and stop interfering with your daily life at home or work. This may mean that you will start to: Have better concentration and focus. Worry will interfere less in your daily thinking. Sleep better. Be less irritable. Have more energy. Have improved memory. Try to recognize when your condition is getting worse. Contact your provider if your symptoms interfere with home or work and you feel like your condition is not improving. Follow these instructions at home: Activity Exercise. Adults should: Exercise for at least 150 minutes each week. The exercise should increase your heart rate and make you sweat (moderate-intensity exercise). Do strengthening exercises at least twice a week. Get the right amount and quality of sleep. Most adults need 7-9 hours of sleep each night. Lifestyle  Eat a healthy diet that includes plenty of vegetables, fruits, whole grains, low-fat dairy products, and lean protein. Do not eat a lot of foods that are high in fats, added sugars, or salt  (sodium). Make choices that simplify your life. Do not use any products that contain nicotine or tobacco. These products include cigarettes, chewing tobacco, and vaping devices, such as e-cigarettes. If you need help quitting, ask your provider. Avoid caffeine, alcohol, and certain over-the-counter cold medicines. These may make you feel worse. Ask your pharmacist which medicines to avoid. General instructions Take over-the-counter and prescription medicines only as told by your provider. Keep all follow-up visits. This is to make sure you are managing your anxiety well or if you need more support. Where to find support You can get help and support from: Self-help groups. Online and community organizations. A trusted spiritual leader. Couples counseling. Family education classes. Family therapy. Where to find more information You may find that joining a support group helps you deal with your anxiety. The following sources can help you find counselors or support groups near you: Mental Health [place]: mentalhealthamerica.net Anxiety and Depression Association of America (ADAA): adaa.org National Alliance on Mental Illness (NAMI): nami.org Contact a health care provider if: You have a hard time staying focused or finishing tasks. You spend many hours a day feeling worried about everyday life. You are very tired because you cannot stop worrying. You start to have headaches or often feel tense. You have chronic nausea or diarrhea. Get help right away if: Your heart feels like it is racing. You have shortness of breath. You have thoughts of hurting yourself or others. Get help   right away if you feel like you may hurt yourself or others, or have thoughts about taking your own life. Go to your nearest emergency room or: Call 911. Call the National Suicide Prevention Lifeline at 1-800-273-8255 or 988. This is open 24 hours a day. Text the Crisis Text Line at 741741. This information is not  intended to replace advice given to you by your health care provider. Make sure you discuss any questions you have with your health care provider. Document Revised: 05/20/2022 Document Reviewed: 12/02/2020 Elsevier Patient Education  2024 [employer]  

## 2023-06-12 ENCOUNTER — Encounter: Payer: Self-pay | Admitting: Nurse Practitioner

## 2023-06-12 ENCOUNTER — Ambulatory Visit: Payer: No Typology Code available for payment source | Admitting: Nurse Practitioner

## 2023-06-12 VITALS — BP 102/63 | HR 63 | Temp 98.8°F | Ht 67.4 in | Wt 198.2 lb

## 2023-06-12 DIAGNOSIS — Z0000 Encounter for general adult medical examination without abnormal findings: Secondary | ICD-10-CM

## 2023-06-12 DIAGNOSIS — E66811 Obesity, class 1: Secondary | ICD-10-CM

## 2023-06-12 DIAGNOSIS — Z13220 Encounter for screening for lipoid disorders: Secondary | ICD-10-CM

## 2023-06-12 DIAGNOSIS — L2084 Intrinsic (allergic) eczema: Secondary | ICD-10-CM | POA: Diagnosis not present

## 2023-06-12 DIAGNOSIS — E6609 Other obesity due to excess calories: Secondary | ICD-10-CM

## 2023-06-12 DIAGNOSIS — Z3041 Encounter for surveillance of contraceptive pills: Secondary | ICD-10-CM | POA: Diagnosis not present

## 2023-06-12 DIAGNOSIS — Z6830 Body mass index (BMI) 30.0-30.9, adult: Secondary | ICD-10-CM

## 2023-06-12 DIAGNOSIS — Z136 Encounter for screening for cardiovascular disorders: Secondary | ICD-10-CM

## 2023-06-12 MED ORDER — NORETHINDRONE ACET-ETHINYL EST 1-20 MG-MCG PO TABS: 1.0000 | ORAL_TABLET | Freq: Every day | ORAL | 4 refills | Status: DC

## 2023-06-12 NOTE — Progress Notes (Signed)
BP 102/63   Pulse 63   Temp 98.8 F (37.1 C) (Oral)   Ht 5' 7.4" (1.712 m)   Wt 198 lb 3.2 oz (89.9 kg)   LMP 05/18/2023 (Exact Date)   BMI 30.68 kg/m    Subjective:    Patient ID: Jessica Melendez, female    DOB: July 20, 1991, 32 y.o.   MRN: [id number]  HPI: Jessica Melendez is a 32 y.o. female presenting on 06/12/2023 for comprehensive medical examination. Current medical complaints include:none  She currently lives with: Menopausal Symptoms: no  Currently getting Wegovy via [facility].  Taking 0.5 MG, has lost a couple pounds so far.  Depression Screen done today and results listed below:     06/12/2023    3:10 PM 11/05/2022    1:58 PM 09/24/2022    9:16 AM 08/07/2022   10:51 AM 07/07/2022    8:11 AM  Depression screen PHQ 2/9  Decreased Interest 0 0 0 0 0  Down, Depressed, Hopeless 0 0 0 0 0  PHQ - 2 Score 0 0 0 0 0  Altered sleeping 0 0 0 0 0  Tired, decreased energy 1 0 0 0 0  Change in appetite 0 0 0 0 0  Feeling bad or failure about yourself  0 0 0 0 0  Trouble concentrating 1 0 0 0 0  Moving slowly or fidgety/restless 0 0 0 0 0  Suicidal thoughts 0 0 0 0 0  PHQ-9 Score 2 0 0 0 0  Difficult doing work/chores Not difficult at all  Not difficult at all  Not difficult at all      06/12/2023    3:10 PM 11/05/2022    1:58 PM 09/24/2022    9:16 AM 08/07/2022   10:51 AM  GAD 7 : Generalized Anxiety Score  Nervous, Anxious, on Edge 0 2 0 0  Control/stop worrying 0 0 0 0  Worry too much - different things 0 0 0 0  Trouble relaxing 0 0 0 0  Restless 0 2 0 0  Easily annoyed or irritable 0 2 0 0  Afraid - awful might happen 0 0 0 0  Total GAD 7 Score 0 6 0 0  Anxiety Difficulty Not difficult at all Not difficult at all Not difficult at all Not difficult at all      07/07/2022    8:10 AM 08/07/2022   10:51 AM 09/24/2022    9:16 AM 11/05/2022    1:58 PM 06/12/2023    3:09 PM  Fall Risk  Falls in the past year? 0 0 0 0 0  Was there an injury with Fall? 0 0 0 0 0  Fall  Risk Category Calculator 0 0 0 0 0  Fall Risk Category (Retired) Low Low     Patient at Risk for Falls Due to No Fall Risks No Fall Risks Other (Comment);No Fall Risks No Fall Risks No Fall Risks  Fall risk Follow up Falls evaluation completed Falls evaluation completed Falls evaluation completed Falls evaluation completed Falls evaluation completed      Past Medical History:  Past Medical History:  Diagnosis Date   Eczema     Surgical History:  Past Surgical History:  Procedure Laterality Date   MASS EXCISION Right 10/07/2018   Procedure: EXCISION AXILLARY MASS;  Surgeon: Leafy Ro, MD;  Location: [facility] ORS;  Service: General;  Laterality: Right;    Medications:  Current Outpatient Medications on File Prior to Visit  Medication Sig  Multiple Vitamins-Minerals (MULTIVITAMIN PO) Take 1 tablet by mouth daily.    Semaglutide-Weight Management (WEGOVY) 0.5 MG/0.5ML SOAJ Inject 0.5 mg into the skin once a week.   tacrolimus (PROTOPIC) 0.03 % ointment Apply topically 2 (two) times daily.   No current facility-administered medications on file prior to visit.    Allergies:  No Known Allergies  Social History:  Social History   Socioeconomic History   Marital status: Single    Spouse name: Not on file   Number of children: Not on file   Years of education: Not on file   Highest education level: Not on file  Occupational History   Occupation: social worker    Comment: Community education officer  Tobacco Use   Smoking status: Never   Smokeless tobacco: Never  Vaping Use   Vaping status: Never Used  Substance and Sexual Activity   Alcohol use: Yes    Comment: very rare use   Drug use: No   Sexual activity: Yes    Partners: Male    Birth control/protection: Pill  Other Topics Concern   Not on file  Social History Narrative   Highest level of education in Biology from [employer].   She aspires to be a Engineer, civil (consulting) and then possibly to be a NP.   Works as a Production assistant, radio at [employer]   Enjoys  playing soccer.    Social Determinants of Health   Financial Resource Strain: Low Risk  (04/10/2022)   Overall Financial Resource Strain (CARDIA)    Difficulty of Paying Living Expenses: Not hard at all  Food Insecurity: No Food Insecurity (04/10/2022)   Hunger Vital Sign    Worried About Running Out of Food in the Last Year: Never true    Ran Out of Food in the Last Year: Never true  Transportation Needs: No Transportation Needs (04/10/2022)   PRAPARE - Administrator, Civil Service (Medical): No    Lack of Transportation (Non-Medical): No  Physical Activity: Insufficiently Active (04/10/2022)   Exercise Vital Sign    Days of Exercise per Week: 4 days    Minutes of Exercise per Session: 30 min  Stress: No Stress Concern Present (04/10/2022)   [employer] of Occupational Health - Occupational Stress Questionnaire    Feeling of Stress : Not at all  Social Connections: Moderately Isolated (04/10/2022)   Social Connection and Isolation Panel [NHANES]    Frequency of Communication with Friends and Family: Three times a week    Frequency of Social Gatherings with Friends and Family: Three times a week    Attends Religious Services: Never    Active Member of Clubs or Organizations: No    Attends Banker Meetings: Never    Marital Status: Living with partner  Intimate Partner Violence: Not At Risk (04/10/2022)   Humiliation, Afraid, Rape, and Kick questionnaire    Fear of Current or Ex-Partner: No    Emotionally Abused: No    Physically Abused: No    Sexually Abused: No   Social History   Tobacco Use  Smoking Status Never  Smokeless Tobacco Never   Social History   Substance and Sexual Activity  Alcohol Use Yes   Comment: very rare use    Family History:  Family History  Problem Relation Age of Onset   Hypertension Father    Sleep apnea Father    Hypertension Paternal Grandmother    Hypertension Paternal Grandfather    Diabetes Paternal  Grandfather     Past  medical history, surgical history, medications, allergies, family history and social history reviewed with patient today and changes made to appropriate areas of the chart.   ROS All other ROS negative except what is listed above and in the HPI.      Objective:    BP 102/63   Pulse 63   Temp 98.8 F (37.1 C) (Oral)   Ht 5' 7.4" (1.712 m)   Wt 198 lb 3.2 oz (89.9 kg)   LMP 05/18/2023 (Exact Date)   BMI 30.68 kg/m   Wt Readings from Last 3 Encounters:  06/12/23 198 lb 3.2 oz (89.9 kg)  11/05/22 197 lb 9.6 oz (89.6 kg)  09/24/22 197 lb (89.4 kg)    Physical Exam Vitals and nursing note reviewed. Exam conducted with a chaperone present.  Constitutional:      General: She is awake. She is not in acute distress.    Appearance: She is well-developed and well-groomed. She is not ill-appearing or toxic-appearing.  HENT:     Head: Normocephalic and atraumatic.     Right Ear: Hearing, tympanic membrane, ear canal and external ear normal. No drainage.     Left Ear: Hearing, tympanic membrane, ear canal and external ear normal. No drainage.     Nose: Nose normal.     Right Sinus: No maxillary sinus tenderness or frontal sinus tenderness.     Left Sinus: No maxillary sinus tenderness or frontal sinus tenderness.     Mouth/Throat:     Mouth: Mucous membranes are moist.     Pharynx: Oropharynx is clear. Uvula midline. No pharyngeal swelling, oropharyngeal exudate or posterior oropharyngeal erythema.  Eyes:     General: Lids are normal.        Right eye: No discharge.        Left eye: No discharge.     Extraocular Movements: Extraocular movements intact.     Conjunctiva/sclera: Conjunctivae normal.     Pupils: Pupils are equal, round, and reactive to light.     Visual Fields: Right eye visual fields normal and left eye visual fields normal.  Neck:     Thyroid: No thyromegaly.     Vascular: No carotid bruit.     Trachea: Trachea normal.  Cardiovascular:      Rate and Rhythm: Normal rate and regular rhythm.     Heart sounds: Normal heart sounds. No murmur heard.    No gallop.  Pulmonary:     Effort: Pulmonary effort is normal. No accessory muscle usage or respiratory distress.     Breath sounds: Normal breath sounds.  Chest:  Breasts:    Right: Normal.     Left: Normal.  Abdominal:     General: Bowel sounds are normal.     Palpations: Abdomen is soft. There is no hepatomegaly or splenomegaly.     Tenderness: There is no abdominal tenderness.  Musculoskeletal:        General: Normal range of motion.     Cervical back: Normal range of motion and neck supple.     Right lower leg: No edema.     Left lower leg: No edema.  Lymphadenopathy:     Head:     Right side of head: No submental, submandibular, tonsillar, preauricular or posterior auricular adenopathy.     Left side of head: No submental, submandibular, tonsillar, preauricular or posterior auricular adenopathy.     Cervical: No cervical adenopathy.     Upper Body:     Right upper body: No supraclavicular,  axillary or pectoral adenopathy.     Left upper body: No supraclavicular, axillary or pectoral adenopathy.  Skin:    General: Skin is warm and dry.     Capillary Refill: Capillary refill takes less than 2 seconds.     Findings: No rash.  Neurological:     Mental Status: She is alert and oriented to person, place, and time.     Gait: Gait is intact.     Deep Tendon Reflexes: Reflexes are normal and symmetric.     Reflex Scores:      Brachioradialis reflexes are 2+ on the right side and 2+ on the left side.      Patellar reflexes are 2+ on the right side and 2+ on the left side. Psychiatric:        Attention and Perception: Attention normal.        Mood and Affect: Mood normal.        Speech: Speech normal.        Behavior: Behavior normal. Behavior is cooperative.        Thought Content: Thought content normal.        Judgment: Judgment normal.    Results for orders placed  or performed in visit on 05/08/22  CBC with Differential/Platelet  Result Value Ref Range   WBC 7.6 3.4 - 10.8 x10E3/uL   RBC 4.70 3.77 - 5.28 x10E6/uL   Hemoglobin 14.9 11.1 - 15.9 g/dL   Hematocrit 86.5 78.4 - 46.6 %   MCV 94 79 - 97 fL   MCH 31.7 26.6 - 33.0 pg   MCHC 33.9 31.5 - 35.7 g/dL   RDW 69.6 (L) 29.5 - 28.4 %   Platelets 353 150 - 450 x10E3/uL   Neutrophils 55 Not Estab. %   Lymphs 36 Not Estab. %   Monocytes 8 Not Estab. %   Eos 1 Not Estab. %   Basos 0 Not Estab. %   Neutrophils Absolute 4.2 1.4 - 7.0 x10E3/uL   Lymphocytes Absolute 2.7 0.7 - 3.1 x10E3/uL   Monocytes Absolute 0.6 0.1 - 0.9 x10E3/uL   EOS (ABSOLUTE) 0.1 0.0 - 0.4 x10E3/uL   Basophils Absolute 0.0 0.0 - 0.2 x10E3/uL   Immature Granulocytes 0 Not Estab. %   Immature Grans (Abs) 0.0 0.0 - 0.1 x10E3/uL  Comprehensive metabolic panel  Result Value Ref Range   Glucose 85 70 - 99 mg/dL   BUN 9 6 - 20 mg/dL   Creatinine, Ser 1.32 0.57 - 1.00 mg/dL   eGFR 79 >44 WN/UUV/2.53   BUN/Creatinine Ratio 9 9 - 23   Sodium 140 134 - 144 mmol/L   Potassium 3.8 3.5 - 5.2 mmol/L   Chloride 102 96 - 106 mmol/L   CO2 23 20 - 29 mmol/L   Calcium 9.4 8.7 - 10.2 mg/dL   Total Protein 7.6 6.0 - 8.5 g/dL   Albumin 4.4 4.0 - 5.0 g/dL   Globulin, Total 3.2 1.5 - 4.5 g/dL   Albumin/Globulin Ratio 1.4 1.2 - 2.2   Bilirubin Total 0.3 0.0 - 1.2 mg/dL   Alkaline Phosphatase 62 44 - 121 IU/L   AST 21 0 - 40 IU/L   ALT 43 (H) 0 - 32 IU/L  Lipid Panel w/o Chol/HDL Ratio  Result Value Ref Range   Cholesterol, Total 162 100 - 199 mg/dL   Triglycerides 76 0 - 149 mg/dL   HDL 48 >66 mg/dL   VLDL Cholesterol Cal 15 5 - 40 mg/dL   LDL Chol Calc (  NIH) 99 0 - 99 mg/dL  TSH  Result Value Ref Range   TSH 0.486 0.450 - 4.500 uIU/mL  VITAMIN D 25 Hydroxy (Vit-D Deficiency, Fractures)  Result Value Ref Range   Vit D, 25-Hydroxy 31.3 30.0 - 100.0 ng/mL  HIV Antibody (routine testing w rflx)  Result Value Ref Range   HIV Screen  4th Generation wRfx Non Reactive Non Reactive  Pregnancy, urine  Result Value Ref Range   Preg Test, Ur Negative Negative  Cytology - PAP  Result Value Ref Range   Adequacy      Satisfactory for evaluation; transformation zone component PRESENT.   Diagnosis      - Negative for intraepithelial lesion or malignancy (NILM)      Assessment & Plan:   Problem List Items Addressed This Visit       Musculoskeletal and Integument   Intrinsic eczema - Primary    Ongoing issue since childhood, has tried topical steroids without benefit.  Continue collaboration with dermatology.  Labs today.      Relevant Orders   CBC with Differential/Platelet   Comprehensive metabolic panel   TSH     Other   Contraception management    Continue current regimen, refills sent in.  Urine pregnancy testing today.      Relevant Orders   Pregnancy, urine   Obesity    BMI 30.68, maintaining.  Wegovy through online program at present.  Educated her at length on use of medication and side effects.  Recommended eating smaller high protein, low fat meals more frequently and exercising 30 mins a day 5 times a week with a goal of 10-15lb weight loss in the next 3 months. Patient voiced their understanding and motivation to adhere to these recommendations.        Relevant Medications   Semaglutide-Weight Management (WEGOVY) 0.5 MG/0.5ML SOAJ   Other Relevant Orders   VITAMIN D 25 Hydroxy (Vit-D Deficiency, Fractures)   Other Visit Diagnoses     Encounter for lipid screening for cardiovascular disease       Lipid panel today.   Relevant Orders   Lipid Panel w/o Chol/HDL Ratio   Encounter for annual physical exam       Annual physical today with labs and health maintenance reviewed, discussed with patient.        Follow up plan: Return in about 1 year (around 06/11/2024) for Annual Exam.   LABORATORY TESTING:  - Pap smear: up to date  IMMUNIZATIONS:   - Tdap: Tetanus vaccination status reviewed:  last tetanus booster within 10 years. - Influenza: Refused - Pneumovax: Not applicable - Prevnar: Not applicable - COVID: Up to date had first two - HPV: Refused - Shingrix vaccine: Not applicable  SCREENING: -Mammogram: Not applicable  - Colonoscopy: Not applicable  - Bone Density: Not applicable  -Hearing Test: Not applicable  -Spirometry: Not applicable   PATIENT COUNSELING:   Advised to take 1 mg of folate supplement per day if capable of pregnancy.   Sexuality: Discussed sexually transmitted diseases, partner selection, use of condoms, avoidance of unintended pregnancy  and contraceptive alternatives.   Advised to avoid cigarette smoking.  I discussed with the patient that most people either abstain from alcohol or drink within safe limits (<=14/week and <=4 drinks/occasion for males, <=7/weeks and <= 3 drinks/occasion for females) and that the risk for alcohol disorders and other health effects rises proportionally with the number of drinks per week and how often a drinker exceeds daily limits.  Discussed cessation/primary prevention of drug use and availability of treatment for abuse.   Diet: Encouraged to adjust caloric intake to maintain  or achieve ideal body weight, to reduce intake of dietary saturated fat and total fat, to limit sodium intake by avoiding high sodium foods and not adding table salt, and to maintain adequate dietary potassium and calcium preferably from fresh fruits, vegetables, and low-fat dairy products.    Stressed the importance of regular exercise  Injury prevention: Discussed safety belts, safety helmets, smoke detector, smoking near bedding or upholstery.   Dental health: Discussed importance of regular tooth brushing, flossing, and dental visits.    NEXT PREVENTATIVE PHYSICAL DUE IN 1 YEAR. Return in about 1 year (around 06/11/2024) for Annual Exam.

## 2023-06-12 NOTE — Assessment & Plan Note (Signed)
BMI 30.68, maintaining.  Wegovy through online program at present.  Educated her at length on use of medication and side effects.  Recommended eating smaller high protein, low fat meals more frequently and exercising 30 mins a day 5 times a week with a goal of 10-15lb weight loss in the next 3 months. Patient voiced their understanding and motivation to adhere to these recommendations.

## 2023-06-12 NOTE — Assessment & Plan Note (Addendum)
Continue current regimen, refills sent in.  Urine pregnancy testing today.

## 2023-06-12 NOTE — Assessment & Plan Note (Signed)
Ongoing issue since childhood, has tried topical steroids without benefit.  Continue collaboration with dermatology.  Labs today.

## 2023-06-13 ENCOUNTER — Other Ambulatory Visit: Payer: Self-pay | Admitting: Nurse Practitioner

## 2023-06-13 DIAGNOSIS — R7989 Other specified abnormal findings of blood chemistry: Secondary | ICD-10-CM

## 2023-06-13 LAB — COMPREHENSIVE METABOLIC PANEL
ALT: 29 [IU]/L (ref 0–32)
AST: 19 [IU]/L (ref 0–40)
Albumin: 4.4 g/dL (ref 4.0–5.0)
Alkaline Phosphatase: 76 [IU]/L (ref 44–121)
BUN/Creatinine Ratio: 11 (ref 9–23)
BUN: 11 mg/dL (ref 6–20)
Bilirubin Total: 0.4 mg/dL (ref 0.0–1.2)
CO2: 25 mmol/L (ref 20–29)
Calcium: 9.8 mg/dL (ref 8.7–10.2)
Chloride: 101 mmol/L (ref 96–106)
Creatinine, Ser: 1.03 mg/dL — ABNORMAL HIGH (ref 0.57–1.00)
Globulin, Total: 2.9 g/dL (ref 1.5–4.5)
Glucose: 85 mg/dL (ref 70–99)
Potassium: 4.4 mmol/L (ref 3.5–5.2)
Sodium: 140 mmol/L (ref 134–144)
Total Protein: 7.3 g/dL (ref 6.0–8.5)
eGFR: 75 mL/min/{1.73_m2} (ref >59)

## 2023-06-13 LAB — CBC WITH DIFFERENTIAL/PLATELET
Basophils Absolute: 0 10*3/uL (ref 0.0–0.2)
Basos: 0 %
EOS (ABSOLUTE): 0.1 10*3/uL (ref 0.0–0.4)
Eos: 1 %
Hematocrit: 43.8 % (ref 34.0–46.6)
Hemoglobin: 14.8 g/dL (ref 11.1–15.9)
Immature Grans (Abs): 0 10*3/uL (ref 0.0–0.1)
Immature Granulocytes: 0 %
Lymphocytes Absolute: 3.4 10*3/uL — ABNORMAL HIGH (ref 0.7–3.1)
Lymphs: 31 %
MCH: 32.9 pg (ref 26.6–33.0)
MCHC: 33.8 g/dL (ref 31.5–35.7)
MCV: 97 fL (ref 79–97)
Monocytes Absolute: 1.2 10*3/uL — ABNORMAL HIGH (ref 0.1–0.9)
Monocytes: 11 %
Neutrophils Absolute: 6.2 10*3/uL (ref 1.4–7.0)
Neutrophils: 57 %
Platelets: 311 10*3/uL (ref 150–450)
RBC: 4.5 x10E6/uL (ref 3.77–5.28)
RDW: 11.8 % (ref 11.7–15.4)
WBC: 10.9 10*3/uL — ABNORMAL HIGH (ref 3.4–10.8)

## 2023-06-13 LAB — LIPID PANEL W/O CHOL/HDL RATIO
Cholesterol, Total: 157 mg/dL (ref 100–199)
HDL: 58 mg/dL (ref >39)
LDL Chol Calc (NIH): 82 mg/dL (ref 0–99)
Triglycerides: 90 mg/dL (ref 0–149)
VLDL Cholesterol Cal: 17 mg/dL (ref 5–40)

## 2023-06-13 LAB — VITAMIN D 25 HYDROXY (VIT D DEFICIENCY, FRACTURES): Vit D, 25-Hydroxy: 30.8 ng/mL (ref 30.0–100.0)

## 2023-06-13 LAB — TSH: TSH: 0.366 u[IU]/mL — ABNORMAL LOW (ref 0.450–4.500)

## 2023-06-13 LAB — PREGNANCY, URINE: Preg Test, Ur: NEGATIVE

## 2023-06-13 NOTE — Progress Notes (Signed)
Contacted via MyChart -- need labs in 4 weeks outpatient   Good morning Ellery, your labs have returned: - CBC is showing mild elevation in white blood cell count and lymphocytes.  Have you been sick recently?  - Kidney function, creatinine and eGFR, remains stable, as is liver function, AST and ALT.  - Lipid panel shows stable levels. - Vitamin D level normal.   - Thyroid lab, TSH, is on low side.  More hyperactive.  This can fluctuate due to many things.  Do you take any Biotin at home? I would like to recheck this and CBC outpatient in 4 weeks.  My staff will call to schedule this.  Any questions? Keep being excellent!!  Thank you for allowing me to participate in your care.  I appreciate you. Kindest regards, Makina Skow

## 2023-06-17 NOTE — Progress Notes (Signed)
Lab appointment scheduled 

## 2023-07-15 ENCOUNTER — Other Ambulatory Visit: Payer: No Typology Code available for payment source

## 2023-07-15 DIAGNOSIS — R7989 Other specified abnormal findings of blood chemistry: Secondary | ICD-10-CM

## 2023-07-16 LAB — CBC WITH DIFFERENTIAL/PLATELET
Basophils Absolute: 0 10*3/uL (ref 0.0–0.2)
Basos: 0 %
EOS (ABSOLUTE): 0.1 10*3/uL (ref 0.0–0.4)
Eos: 1 %
Hematocrit: 43.8 % (ref 34.0–46.6)
Hemoglobin: 14.3 g/dL (ref 11.1–15.9)
Immature Grans (Abs): 0 10*3/uL (ref 0.0–0.1)
Immature Granulocytes: 0 %
Lymphocytes Absolute: 2.8 10*3/uL (ref 0.7–3.1)
Lymphs: 35 %
MCH: 32.6 pg (ref 26.6–33.0)
MCHC: 32.6 g/dL (ref 31.5–35.7)
MCV: 100 fL — ABNORMAL HIGH (ref 79–97)
Monocytes Absolute: 0.6 10*3/uL (ref 0.1–0.9)
Monocytes: 8 %
Neutrophils Absolute: 4.3 10*3/uL (ref 1.4–7.0)
Neutrophils: 56 %
Platelets: 357 10*3/uL (ref 150–450)
RBC: 4.38 x10E6/uL (ref 3.77–5.28)
RDW: 11.2 % — ABNORMAL LOW (ref 11.7–15.4)
WBC: 7.9 10*3/uL (ref 3.4–10.8)

## 2023-07-16 LAB — TSH: TSH: 0.653 u[IU]/mL (ref 0.450–4.500)

## 2023-07-16 NOTE — Progress Notes (Signed)
Contacted via MyChart   Good afternoon Jessica Melendez, your labs have returned and white blood cell count + thyroid are in normal ranges this check.  Any questions? Keep being stellar!!  Thank you for allowing me to participate in your care.  I appreciate you. Kindest regards, Allyn Bertoni

## 2023-08-31 ENCOUNTER — Telehealth: Payer: No Typology Code available for payment source | Admitting: Family Medicine

## 2023-08-31 DIAGNOSIS — B309 Viral conjunctivitis, unspecified: Secondary | ICD-10-CM | POA: Diagnosis not present

## 2023-08-31 DIAGNOSIS — J029 Acute pharyngitis, unspecified: Secondary | ICD-10-CM | POA: Diagnosis not present

## 2023-08-31 MED ORDER — LIDOCAINE VISCOUS HCL 2 % MT SOLN
15.0000 mL | OROMUCOSAL | 0 refills | Status: DC | PRN
Start: 2023-08-31 — End: 2024-05-31

## 2023-08-31 MED ORDER — POLYMYXIN B-TRIMETHOPRIM 10000-0.1 UNIT/ML-% OP SOLN
1.0000 [drp] | Freq: Four times a day (QID) | OPHTHALMIC | 0 refills | Status: DC
Start: 2023-08-31 — End: 2024-05-31

## 2023-08-31 NOTE — Progress Notes (Signed)
 Virtual Visit Consent   Jessica Melendez, you are scheduled for a virtual visit with a [facility] provider today. Just as with appointments in the office, your consent must be obtained to participate. Your consent will be active for this visit and any virtual visit you may have with one of our providers in the next 365 days. If you have a MyChart account, a copy of this consent can be sent to you electronically.  As this is a virtual visit, video technology does not allow for your provider to perform a traditional examination. This may limit your provider's ability to fully assess your condition. If your provider identifies any concerns that need to be evaluated in person or the need to arrange testing (such as labs, EKG, etc.), we will make arrangements to do so. Although advances in technology are sophisticated, we cannot ensure that it will always work on either your end or our end. If the connection with a video visit is poor, the visit may have to be switched to a telephone visit. With either a video or telephone visit, we are not always able to ensure that we have a secure connection.  By engaging in this virtual visit, you consent to the provision of healthcare and authorize for your insurance to be billed (if applicable) for the services provided during this visit. Depending on your insurance coverage, you may receive a charge related to this service.  I need to obtain your verbal consent now. Are you willing to proceed with your visit today? Jessica Melendez has provided verbal consent on 08/31/2023 for a virtual visit (video or telephone). Jessica CHRISTELLA Barefoot, NP  Date: 08/31/2023 9:33 AM  Virtual Visit via Video Note   I, Jessica Melendez, connected with  Jessica Melendez  ([id number], Apr 09, 1991) on 08/31/23 at  9:30 AM EST by a video-enabled telemedicine application and verified that I am speaking with the correct person using two identifiers.  Location: Patient: Virtual Visit Location Patient:  Home Provider: Virtual Visit Location Provider: Home Office   I discussed the limitations of evaluation and management by telemedicine and the availability of in person appointments. The patient expressed understanding and agreed to proceed.    History of Present Illness: Jessica Melendez is a 33 y.o. who identifies as a female who was assigned female at birth, and is being seen today for sore throat  Onset was Friday with sore throat, that has progressed  Associated symptoms are both eyes are red and itching, and drainage/crusting around them  Modifying factors are mucinex, tylenol  ES, allergy relief, ibuprofen   Denies chest pain, shortness of breath, fevers, chills  Exposure to sick contacts- unknown COVID test:  no   Problems:  Patient Active Problem List   Diagnosis Date Noted   Intrinsic eczema 04/10/2022   Obesity 04/10/2022   Contraception management 08/22/2014    Allergies: No Known Allergies Medications:  Current Outpatient Medications:    Multiple Vitamins-Minerals (MULTIVITAMIN PO), Take 1 tablet by mouth daily. , Disp: , Rfl:    norethindrone -ethinyl estradiol  (LOESTRIN) 1-20 MG-MCG tablet, Take 1 tablet by mouth daily., Disp: 84 tablet, Rfl: 4   Semaglutide -Weight Management (WEGOVY ) 0.5 MG/0.5ML SOAJ, Inject 0.5 mg into the skin once a week., Disp: , Rfl:    tacrolimus  (PROTOPIC ) 0.03 % ointment, Apply topically 2 (two) times daily., Disp: 100 g, Rfl: 2  Observations/Objective: Patient is well-developed, well-nourished in no acute distress.  Resting comfortably  at home.  Head is normocephalic, atraumatic.  No labored breathing.  Speech is clear and coherent with logical content.  Patient is alert and oriented at baseline.  Bilateral sclera redness  EOEM intact   Assessment and Plan:   1. Viral pharyngitis (Primary)  - lidocaine  (XYLOCAINE ) 2 % solution; Use as directed 15 mLs in the mouth or throat as needed for mouth pain.  Dispense: 100 mL; Refill: 0  2.  Viral conjunctivitis  - trimethoprim -polymyxin b  (POLYTRIM ) ophthalmic solution; Place 1 drop into both eyes every 6 (six) hours.  Dispense: 10 mL; Refill: 0    -suspect all viral, symptom management reviewed  -for eyes due to crusting and discomfort will order coverage for bacterial -antihistamine continue daily for itching -follow up in person if worsening -info on both included on AVS   - no red flags for strep, or air way concern    Reviewed side effects, risks and benefits of medication.    Patient acknowledged agreement and understanding of the plan.   Past Medical, Surgical, Social History, Allergies, and Medications have been Reviewed.    Follow Up Instructions: I discussed the assessment and treatment plan with the patient. The patient was provided an opportunity to ask questions and all were answered. The patient agreed with the plan and demonstrated an understanding of the instructions.  A copy of instructions were sent to the patient via MyChart unless otherwise noted below.    The patient was advised to call back or seek an in-person evaluation if the symptoms worsen or if the condition fails to improve as anticipated.    Jessica CHRISTELLA Barefoot, NP

## 2023-08-31 NOTE — Patient Instructions (Addendum)
 Thersia Picket, thank you for joining Jessica CHRISTELLA Barefoot, NP for today's virtual visit.  While this provider is not your primary care provider (PCP), if your PCP is located in our provider database this encounter information will be shared with them immediately following your visit.   A [facility] MyChart account gives you access to today's visit and all your visits, tests, and labs performed at [facility]  click here if you don't have a [facility] MyChart account or go to [email or web address]  Consent: (Patient) Senta Kantor provided verbal consent for this virtual visit at the beginning of the encounter.  Current Medications:  Current Outpatient Medications:    lidocaine  (XYLOCAINE ) 2 % solution, Use as directed 15 mLs in the mouth or throat as needed for mouth pain., Disp: 100 mL, Rfl: 0   trimethoprim -polymyxin b  (POLYTRIM ) ophthalmic solution, Place 1 drop into both eyes every 6 (six) hours., Disp: 10 mL, Rfl: 0   Multiple Vitamins-Minerals (MULTIVITAMIN PO), Take 1 tablet by mouth daily. , Disp: , Rfl:    norethindrone -ethinyl estradiol  (LOESTRIN) 1-20 MG-MCG tablet, Take 1 tablet by mouth daily., Disp: 84 tablet, Rfl: 4   Semaglutide -Weight Management (WEGOVY ) 0.5 MG/0.5ML SOAJ, Inject 0.5 mg into the skin once a week., Disp: , Rfl:    tacrolimus  (PROTOPIC ) 0.03 % ointment, Apply topically 2 (two) times daily., Disp: 100 g, Rfl: 2   Medications ordered in this encounter:  Meds ordered this encounter  Medications   trimethoprim -polymyxin b  (POLYTRIM ) ophthalmic solution    Sig: Place 1 drop into both eyes every 6 (six) hours.    Dispense:  10 mL    Refill:  0    Supervising Provider:   BLAISE ALEENE KIDD [[id number]]   lidocaine  (XYLOCAINE ) 2 % solution    Sig: Use as directed 15 mLs in the mouth or throat as needed for mouth pain.    Dispense:  100 mL    Refill:  0    Supervising Provider:   BLAISE ALEENE KIDD [[id number]]     *If you need refills on other  medications prior to your next appointment, please contact your pharmacy*  Follow-Up: Call back or seek an in-person evaluation if the symptoms worsen or if the condition fails to improve as anticipated.  [facility] [phone]  Other Instructions  Viral Conjunctivitis, Adult  Viral conjunctivitis is an inflammation of the conjunctiva. The conjunctiva is the clear membrane that covers the white part of the eye and the inner surface of the eyelid. The inflammation is caused by a viral infection. The blood vessels in the conjunctiva become large, causing the eye to become red or pink and often itchy and tearing. The inflammation usually starts in one eye and goes to the other in a day or two. Infections usually go away over 1-2 weeks. Viral conjunctivitis is contagious. This means it can be easily passed from one person to another. This condition is often called pink eye. What are the causes? This condition is caused by a virus. It can be spread by touching objects that have been contaminated with the virus, such as doorknobs or towels, and then touching your eye. It can also be passed through tiny droplets, such as from coughing or sneezing. What increases the risk? You are more likely to develop this condition if you have a cold or the flu, or are in close contact with a person who has pink eye. What are the signs or symptoms? Symptoms of this condition include:  Redness in the eye. Tearing or watery eyes. Itchy and irritated eyes. Burning feeling in the eyes. Clear drainage from the eye. Swollen eyelids. A gritty feeling in the eye. Light sensitivity. This condition often occurs with other symptoms, such as nasal congestion, cough, and fever. How is this diagnosed? This condition is diagnosed with a medical history and physical exam. If you have discharge from your eye, the discharge may be tested for a virus or to rule out other causes of conjunctivitis. How is this  treated? Viral conjunctivitis does not respond to medicines that kill bacteria (antibiotics). The condition most often goes away on its own in 1-2 weeks. If treatment is needed, it is aimed at relieving your symptoms and preventing the spread of infection. This may be done with artificial tear drops, antihistamine drops, or other eye medicines. In rare cases, steroid eye drops or anti-herpes virus medicines may be prescribed. Follow these instructions at home: Medicines  Take or apply over-the-counter and prescription medicines only as told by your health care provider. Do not touch the edge of the eyelid with the eye-drop bottle or ointment tube when applying medicines to the affected eye. This will prevent the spread of the infection to the other eye or to other people. Eye care Avoid touching or rubbing your eyes. Apply a clean, cool, wet washcloth onto your eye for 10-20 minutes, 3-4 times per day, or as told by your health care provider. If you wear contact lenses, do notwear them until the inflammation is gone and your health care provider says it is safe to wear them again. Ask your health care provider how to disinfect or replace your contact lenses before using them again. Wear glasses until you can resume wearing contacts. Avoid wearing eye makeup until the inflammation is gone. Throw away any old eye cosmetics that may be contaminated. Gently wipe away any crusting from your eye with a wet washcloth or a cotton ball. General instructions Change or wash your pillowcase every day or as told by your health care provider. Do not share towels, pillowcases, washcloths, eye makeup, makeup brushes, eye drops, contact lenses, or eyeglasses. This may spread the infection. Wash your hands often with soap and water. Use paper towels to dry your hands. If soap and water are not available, use hand sanitizer. Avoid contact with other people until your eye is no longer red and tearing, or as told by  your health care provider. Keep all follow-up visits. Contact a health care provider if: Your symptoms do not improve with treatment, or they get worse. You have increased pain. Your vision becomes blurry. You have a fever. You have facial pain, redness, or swelling. You have yellow or green drainage coming from your eye. You have new symptoms. Get help right away if: You develop severe pain. Your vision gets much worse. Summary Viral conjunctivitis is an inflammation of the conjunctiva. It usually goes away in 1-2 weeks. The condition is caused by a virus and is spread by touching contaminated objects or breathing in droplets from a cough or a sneeze. This condition is usually treated with medicines and cold compresses to relieve the symptoms. Because it is caused by a virus, it should not be treated with antibiotics. This condition is very contagious. To prevent infection, avoid close contact with others, wash your hands often, and do not share towels or washcloths. Contact a health care provider if your symptoms do not go away with treatment, or if you have blurry vision,  facial swelling, or increased pain. This information is not intended to replace advice given to you by your health care provider. Make sure you discuss any questions you have with your health care provider. Document Revised: 09/18/2021 Document Reviewed: 09/18/2021 Elsevier Patient Education  2024 Elsevier Inc. Pharyngitis  Pharyngitis is a sore throat (pharynx). This is when there is redness, pain, and swelling in your throat. Most of the time, this condition gets better on its own. In some cases, you may need medicine. What are the causes? An infection from a virus. An infection from bacteria. Allergies. What increases the risk? Being 9-20 years old. Being in crowded environments. These include: Daycares. Schools. Dormitories. Living in a place with cold temperatures outside. Having a weakened  disease-fighting (immune) system. What are the signs or symptoms? Symptoms may vary depending on the cause. Common symptoms include: Sore throat. Tiredness (fatigue). Low-grade fever. Stuffy nose. Cough. Headache. Other symptoms may include: Glands in the neck (lymph nodes) that are swollen. Skin rashes. Film on the throat or tonsils. This can be caused by an infection from bacteria. Vomiting. Red, itchy eyes. Loss of appetite. Joint pain and muscle aches. Tonsils that are temporarily bigger than usual (enlarged). How is this treated? Many times, treatment is not needed. This condition usually gets better in 3-4 days without treatment. If the infection is caused by a bacteria, you may be need to take antibiotics. Follow these instructions at home: Medicines Take over-the-counter and prescription medicines only as told by your doctor. If you were prescribed an antibiotic medicine, take it as told by your doctor. Do not stop taking the antibiotic even if you start to feel better. Use throat lozenges or sprays to soothe your throat as told by your doctor. Children can get pharyngitis. Do not give your child aspirin. Managing pain To help with pain, try: Sipping warm liquids, such as: Broth. Herbal tea. Warm water. Eating or drinking cold or frozen liquids, such as frozen ice pops. Rinsing your mouth (gargle) with a salt water mixture 3-4 times a day or as needed. To make salt water, dissolve -1 tsp (3-6 g) of salt in 1 cup (237 mL) of warm water. Do not swallow this mixture. Sucking on hard candy or throat lozenges. Putting a cool-mist humidifier in your bedroom at night to moisten the air. Sitting in the bathroom with the door closed for 5-10 minutes while you run hot water in the shower.  General instructions  Do not smoke or use any products that contain nicotine or tobacco. If you need help quitting, ask your doctor. Rest as told by your doctor. Drink enough fluid to  keep your pee (urine) pale yellow. How is this prevented? Wash your hands often for at least 20 seconds with soap and water. If soap and water are not available, use hand sanitizer. Do not touch your eyes, nose, or mouth with unwashed hands. Wash hands after touching these areas. Do not share cups or eating utensils. Avoid close contact with people who are sick. Contact a doctor if: You have large, tender lumps in your neck. You have a rash. You cough up green, yellow-brown, or bloody spit. Get help right away if: You have a stiff neck. You drool or cannot swallow liquids. You cannot drink or take medicines without vomiting. You have very bad pain that does not go away with medicine. You have problems breathing, and it is not from a stuffy nose. You have new pain and swelling in your knees, ankles,  wrists, or elbows. These symptoms may be an emergency. Get help right away. Call your local emergency services (911 in the [place] Do not wait to see if the symptoms will go away. Do not drive yourself to the hospital. Summary Pharyngitis is a sore throat (pharynx). This is when there is redness, pain, and swelling in your throat. Most of the time, pharyngitis gets better on its own. Sometimes, you may need medicine. If you were prescribed an antibiotic medicine, take it as told by your doctor. Do not stop taking the antibiotic even if you start to feel better. This information is not intended to replace advice given to you by your health care provider. Make sure you discuss any questions you have with your health care provider. Document Revised: 11/07/2020 Document Reviewed: 11/07/2020 Elsevier Patient Education  2024 Elsevier Inc.   If you have been instructed to have an in-person evaluation today at a local Urgent Care facility, please use the link below. It will take you to a list of all of our available [facility], including address, phone number and hours of operation.  Please do not delay care.  [facility]  If you or a family member do not have a primary care provider, use the link below to schedule a visit and establish care. When you choose a [facility] primary care physician or advanced practice provider, you gain a long-term partner in health. Find a Primary Care Provider  Learn more about [facility]'s in-office and virtual care options:  - Get Care Now

## 2023-10-22 ENCOUNTER — Telehealth: Payer: No Typology Code available for payment source | Admitting: Physician Assistant

## 2023-10-22 DIAGNOSIS — J069 Acute upper respiratory infection, unspecified: Secondary | ICD-10-CM

## 2023-10-22 NOTE — Progress Notes (Signed)
 I have spent 5 minutes in review of e-visit questionnaire, review and updating patient chart, medical decision making and response to patient.   Piedad Climes, PA-C

## 2023-10-22 NOTE — Progress Notes (Signed)
 Message sent to patient requesting further input regarding current symptoms. Awaiting patient response.

## 2023-10-22 NOTE — Progress Notes (Signed)
 E-Visit for Upper Respiratory Infection  ? ?We are sorry you are not feeling well.  Here is how we plan to help! ? ?Based on what you have shared with me, it looks like you may have a viral upper respiratory infection.  Upper respiratory infections are caused by a large number of viruses; however, rhinovirus is the most common cause.  ? ?Symptoms vary from person to person, with common symptoms including sore throat, cough, fatigue or lack of energy and feeling of general discomfort.  A low-grade fever of up to 100.4 may present, but is often uncommon.  Symptoms vary however, and are closely related to a person's age or underlying illnesses.  The most common symptoms associated with an upper respiratory infection are nasal discharge or congestion, cough, sneezing, headache and pressure in the ears and face.  These symptoms usually persist for about 3 to 10 days, but can last up to 2 weeks.  It is important to know that upper respiratory infections do not cause serious illness or complications in most cases.   ? ?Upper respiratory infections can be transmitted from person to person, with the most common method of transmission being a person's hands.  The virus is able to live on the skin and can infect other persons for up to 2 hours after direct contact.  Also, these can be transmitted when someone coughs or sneezes; thus, it is important to cover the mouth to reduce this risk.  To keep the spread of the illness at bay, good hand hygiene is very important. ? ?This is an infection that is most likely caused by a virus. There are no specific treatments other than to help you with the symptoms until the infection runs its course.  We are sorry you are not feeling well.  Here is how we plan to help! ? ? ?For nasal congestion, you may use an oral decongestants such as Mucinex D or if you have glaucoma or high blood pressure use plain Mucinex.  Saline nasal spray or nasal drops can help and can safely be used as often as  needed for congestion.  ? ?If you do not have a history of heart disease, hypertension, diabetes or thyroid disease, prostate/bladder issues or glaucoma, you may also use Sudafed to treat nasal congestion.  It is highly recommended that you consult with a pharmacist or your primary care physician to ensure this medication is safe for you to take.    ? ?If you have a cough, you may use cough suppressants such as Delsym and Robitussin.  If you have glaucoma or high blood pressure, you can also use Coricidin HBP.   ? ? ?If you have a sore or scratchy throat, use a saltwater gargle- ? to ? teaspoon of salt dissolved in a 4-ounce to 8-ounce glass of warm water.  Gargle the solution for approximately 15-30 seconds and then spit.  It is important not to swallow the solution.  You can also use throat lozenges/cough drops and Chloraseptic spray to help with throat pain or discomfort.  Warm or cold liquids can also be helpful in relieving throat pain. ? ?For headache, pain or general discomfort, you can use Ibuprofen or Tylenol as directed.   ?Some authorities believe that zinc sprays or the use of Echinacea may shorten the course of your symptoms. ? ? ?HOME CARE ?Only take medications as instructed by your medical team. ?Be sure to drink plenty of fluids. Water is fine as well as fruit juices, sodas  and electrolyte beverages. You may want to stay away from caffeine or alcohol. If you are nauseated, try taking small sips of liquids. How do you know if you are getting enough fluid? Your urine should be a pale yellow or almost colorless. ?Get rest. ?Taking a steamy shower or using a humidifier may help nasal congestion and ease sore throat pain. You can place a towel over your head and breathe in the steam from hot water coming from a faucet. ?Using a saline nasal spray works much the same way. ?Cough drops, hard candies and sore throat lozenges may ease your cough. ?Avoid close contacts especially the very young and the  elderly ?Cover your mouth if you cough or sneeze ?Always remember to wash your hands.  ? ?GET HELP RIGHT AWAY IF: ?You develop worsening fever. ?If your symptoms do not improve within 10 days ?You develop yellow or green discharge from your nose over 3 days. ?You have coughing fits ?You develop a severe head ache or visual changes. ?You develop shortness of breath, difficulty breathing or start having chest pain ?Your symptoms persist after you have completed your treatment plan ? ?MAKE SURE YOU  ?Understand these instructions. ?Will watch your condition. ?Will get help right away if you are not doing well or get worse. ? ?Thank you for choosing an e-visit. ? ?Your e-visit answers were reviewed by a board certified advanced clinical practitioner to complete your personal care plan. Depending upon the condition, your plan could have included both over the counter or prescription medications. ? ?Please review your pharmacy choice. Make sure the pharmacy is open so you can pick up prescription now. If there is a problem, you may contact your provider through [employer] and have the prescription routed to another pharmacy.  Your safety is important to [place]. If you have drug allergies check your prescription carefully.  ? ?For the next 24 hours you can use MyChart to ask questions about today's visit, request a non-urgent call back, or ask for a work or school excuse. ?You will get an email in the next two days asking about your experience. I hope that your e-visit has been valuable and will speed your recovery. ? ? ? ? ?

## 2024-05-24 ENCOUNTER — Encounter: Payer: Self-pay | Admitting: Nurse Practitioner

## 2024-05-31 ENCOUNTER — Ambulatory Visit: Admitting: Nurse Practitioner

## 2024-05-31 ENCOUNTER — Encounter: Payer: Self-pay | Admitting: Nurse Practitioner

## 2024-05-31 VITALS — BP 118/73 | HR 67 | Temp 98.9°F | Ht 67.5 in | Wt 178.0 lb

## 2024-05-31 DIAGNOSIS — N939 Abnormal uterine and vaginal bleeding, unspecified: Secondary | ICD-10-CM

## 2024-05-31 NOTE — Progress Notes (Signed)
 BP 118/73   Pulse 67   Temp 98.9 F (37.2 C) (Oral)   Ht 5' 7.5 (1.715 m)   Wt 178 lb (80.7 kg)   LMP  (LMP Unknown)   SpO2 98%   BMI 27.47 kg/m    Subjective:    Patient ID: Jessica Melendez, female    DOB: 02-27-1991, 34 y.o.   MRN: [id number]  HPI: Jessica Melendez is a 33 y.o. female  Chief Complaint  Patient presents with   Menstrual Problem    Patient states she missed a birth control pill one day so she doubled up the next day. States after, she started bleeding. Patient states that she hasn't had bleeding on her birth control previously.    Patient states she missed a birth control pill one day so she doubled up the next day and started bleeding.  States she she has been bleeding for about 3 weeks.  She is having some cramping and some clots.  She does not have a period with this birth control.  It has been 1.5 years since she has had a period.      Relevant past medical, surgical, family and social history reviewed and updated as indicated. Interim medical history since our last visit reviewed. Allergies and medications reviewed and updated.  Review of Systems  Genitourinary:  Positive for vaginal bleeding.    Per HPI unless specifically indicated above     Objective:    BP 118/73   Pulse 67   Temp 98.9 F (37.2 C) (Oral)   Ht 5' 7.5 (1.715 m)   Wt 178 lb (80.7 kg)   LMP  (LMP Unknown)   SpO2 98%   BMI 27.47 kg/m   Wt Readings from Last 3 Encounters:  05/31/24 178 lb (80.7 kg)  06/12/23 198 lb 3.2 oz (89.9 kg)  11/05/22 197 lb 9.6 oz (89.6 kg)    Physical Exam Vitals and nursing note reviewed.  Constitutional:      General: She is not in acute distress.    Appearance: Normal appearance. She is normal weight. She is not ill-appearing, toxic-appearing or diaphoretic.  HENT:     Head: Normocephalic.     Right Ear: External ear normal.     Left Ear: External ear normal.     Nose: Nose normal.     Mouth/Throat:     Mouth: Mucous membranes are moist.      Pharynx: Oropharynx is clear.  Eyes:     General:        Right eye: No discharge.        Left eye: No discharge.     Extraocular Movements: Extraocular movements intact.     Conjunctiva/sclera: Conjunctivae normal.     Pupils: Pupils are equal, round, and reactive to light.  Cardiovascular:     Rate and Rhythm: Normal rate and regular rhythm.     Heart sounds: No murmur heard. Pulmonary:     Effort: Pulmonary effort is normal. No respiratory distress.     Breath sounds: Normal breath sounds. No wheezing or rales.  Musculoskeletal:     Cervical back: Normal range of motion and neck supple.  Skin:    General: Skin is warm and dry.     Capillary Refill: Capillary refill takes less than 2 seconds.  Neurological:     General: No focal deficit present.     Mental Status: She is alert and oriented to person, place, and time. Mental status is at baseline.  Psychiatric:  Mood and Affect: Mood normal.        Behavior: Behavior normal.        Thought Content: Thought content normal.        Judgment: Judgment normal.     Results for orders placed or performed in visit on 07/15/23  TSH   Collection Time: 07/15/23  8:28 AM  Result Value Ref Range   TSH 0.653 0.450 - 4.500 uIU/mL  CBC with Differential/Platelet   Collection Time: 07/15/23  8:28 AM  Result Value Ref Range   WBC 7.9 3.4 - 10.8 x10E3/uL   RBC 4.38 3.77 - 5.28 x10E6/uL   Hemoglobin 14.3 11.1 - 15.9 g/dL   Hematocrit 56.1 65.9 - 46.6 %   MCV 100 (H) 79 - 97 fL   MCH 32.6 26.6 - 33.0 pg   MCHC 32.6 31.5 - 35.7 g/dL   RDW 88.7 (L) 88.2 - 84.5 %   Platelets 357 150 - 450 x10E3/uL   Neutrophils 56 Not Estab. %   Lymphs 35 Not Estab. %   Monocytes 8 Not Estab. %   Eos 1 Not Estab. %   Basos 0 Not Estab. %   Neutrophils Absolute 4.3 1.4 - 7.0 x10E3/uL   Lymphocytes Absolute 2.8 0.7 - 3.1 x10E3/uL   Monocytes Absolute 0.6 0.1 - 0.9 x10E3/uL   EOS (ABSOLUTE) 0.1 0.0 - 0.4 x10E3/uL   Basophils Absolute 0.0 0.0 -  0.2 x10E3/uL   Immature Granulocytes 0 Not Estab. %   Immature Grans (Abs) 0.0 0.0 - 0.1 x10E3/uL      Assessment & Plan:   Problem List Items Addressed This Visit   None Visit Diagnoses       Abnormal vaginal bleeding    -  Primary   Suspect it is related to missing birth control but will improve with continuing new pack of birth control. Will order transvaginal US  to evaluate further. Will make recommendations once results are back.    Relevant Orders   US  Pelvic Complete With Transvaginal        Follow up plan: No follow-ups on file.

## 2024-06-12 NOTE — Patient Instructions (Incomplete)
 Be Involved in Caring For Your Health:  Taking Medications When medications are taken as directed, they can greatly improve your health. But if they are not taken as prescribed, they may not work. In some cases, not taking them correctly can be harmful. To help ensure your treatment remains effective and safe, understand your medications and how to take them. Bring your medications to each visit for review by your provider.  Your lab results, notes, and after visit summary will be available on My Chart. We strongly encourage you to use this feature. If lab results are abnormal the clinic will contact you with the appropriate steps. If the clinic does not contact you assume the results are satisfactory. You can always view your results on My Chart. If you have questions regarding your health or results, please contact the clinic during office hours. You can also ask questions on My Chart.  We at [facility] are grateful that you chose us  to provide your care. We strive to provide evidence-based and compassionate care and are always looking for feedback. If you get a survey from the clinic please complete this so we can hear your opinions.  Healthy Eating, Adult Healthy eating may help you get and keep a healthy body weight, reduce the risk of chronic disease, and live a long and productive life. It is important to follow a healthy eating pattern. Your nutritional and calorie needs should be met mainly by different nutrient-rich foods. What are tips for following this plan? Reading food labels Read labels and choose the following: Reduced or low sodium products. Juices with 100% fruit juice. Foods with low saturated fats (<3 g per serving) and high polyunsaturated and monounsaturated fats. Foods with whole grains, such as whole wheat, cracked wheat, brown rice, and wild rice. Whole grains that are fortified with folic acid. This is recommended for females who are pregnant or who want to  become pregnant. Read labels and do not eat or drink the following: Foods or drinks with added sugars. These include foods that contain brown sugar, corn sweetener, corn syrup, dextrose , fructose, glucose, high-fructose corn syrup, honey, invert sugar, lactose, malt syrup, maltose, molasses, raw sugar, sucrose, trehalose, or turbinado sugar. Limit your intake of added sugars to less than 10% of your total daily calories. Do not eat more than the following amounts of added sugar per day: 6 teaspoons (25 g) for females. 9 teaspoons (38 g) for males. Foods that contain processed or refined starches and grains. Refined grain products, such as white flour, degermed cornmeal, white bread, and white rice. Shopping Choose nutrient-rich snacks, such as vegetables, whole fruits, and nuts. Avoid high-calorie and high-sugar snacks, such as potato chips, fruit snacks, and candy. Use oil-based dressings and spreads on foods instead of solid fats such as butter, margarine, sour cream, or cream cheese. Limit pre-made sauces, mixes, and instant products such as flavored rice, instant noodles, and ready-made pasta. Try more plant-protein sources, such as tofu, tempeh, black beans, edamame, lentils, nuts, and seeds. Explore eating plans such as the Mediterranean diet or vegetarian diet. Try heart-healthy dips made with beans and healthy fats like hummus and guacamole. Vegetables go great with these. Cooking Use oil to saut or stir-fry foods instead of solid fats such as butter, margarine, or lard. Try baking, boiling, grilling, or broiling instead of frying. Remove the fatty part of meats before cooking. Steam vegetables in water  or broth. Meal planning  At meals, imagine dividing your plate into fourths: One-half of  your plate is fruits and vegetables. One-fourth of your plate is whole grains. One-fourth of your plate is protein, especially lean meats, poultry, eggs, tofu, beans, or nuts. Include low-fat  dairy as part of your daily diet. Lifestyle Choose healthy options in all settings, including home, work, school, restaurants, or stores. Prepare your food safely: Wash your hands after handling raw meats. Where you prepare food, keep surfaces clean by regularly washing with hot, soapy water . Keep raw meats separate from ready-to-eat foods, such as fruits and vegetables. Cook seafood, meat, poultry, and eggs to the recommended temperature. Get a food thermometer. Store foods at safe temperatures. In general: Keep cold foods at 84F (4.4C) or below. Keep hot foods at 184F (60C) or above. Keep your freezer at [facility] (-17.8C) or below. Foods are not safe to eat if they have been between the temperatures of 40-184F (4.4-60C) for more than 2 hours. What foods should I eat? Fruits Aim to eat 1-2 cups of fresh, canned (in natural juice), or frozen fruits each day. One cup of fruit equals 1 small apple, 1 large banana, 8 large strawberries, 1 cup (237 g) canned fruit,  cup (82 g) dried fruit, or 1 cup (240 mL) 100% juice. Vegetables Aim to eat 2-4 cups of fresh and frozen vegetables each day, including different varieties and colors. One cup of vegetables equals 1 cup (91 g) broccoli or cauliflower florets, 2 medium carrots, 2 cups (150 g) raw, leafy greens, 1 large tomato, 1 large bell pepper, 1 large sweet potato, or 1 medium white potato. Grains Aim to eat 5-10 ounce-equivalents of whole grains each day. Examples of 1 ounce-equivalent of grains include 1 slice of bread, 1 cup (40 g) ready-to-eat cereal, 3 cups (24 g) popcorn, or  cup (93 g) cooked rice. Meats and other proteins Try to eat 5-7 ounce-equivalents of protein each day. Examples of 1 ounce-equivalent of protein include 1 egg,  oz nuts (12 almonds, 24 pistachios, or 7 walnut halves), 1/4 cup (90 g) cooked beans, 6 tablespoons (90 g) hummus or 1 tablespoon (16 g) peanut butter. A cut of meat or fish that is the size of a deck of  cards is about 3-4 ounce-equivalents (85 g). Of the protein you eat each week, try to have at least 8 sounce (227 g) of seafood. This is about 2 servings per week. This includes salmon, trout, herring, sardines, and anchovies. Dairy Aim to eat 3 cup-equivalents of fat-free or low-fat dairy each day. Examples of 1 cup-equivalent of dairy include 1 cup (240 mL) milk, 8 ounces (250 g) yogurt, 1 ounces (44 g) natural cheese, or 1 cup (240 mL) fortified soy milk. Fats and oils Aim for about 5 teaspoons (21 g) of fats and oils per day. Choose monounsaturated fats, such as canola and olive oils, mayonnaise made with olive oil or avocado oil, avocados, peanut butter, and most nuts, or polyunsaturated fats, such as sunflower, corn, and soybean oils, walnuts, pine nuts, sesame seeds, sunflower seeds, and flaxseed. Beverages Aim for 6 eight-ounce glasses of water  per day. Limit coffee to 3-5 eight-ounce cups per day. Limit caffeinated beverages that have added calories, such as soda and energy drinks. If you drink alcohol: Limit how much you have to: 0-1 drink a day if you are female. 0-2 drinks a day if you are female. Know how much alcohol is in your drink. In the [place] one drink is one 12 oz bottle of beer (355 mL), one 5 oz glass of wine (  148 mL), or one 1 oz glass of hard liquor (44 mL). Seasoning and other foods Try not to add too much salt to your food. Try using herbs and spices instead of salt. Try not to add sugar to food. This information is based on [place] nutrition guidelines. To learn more, visit [employer]. Exact amounts may vary. You may need different amounts. This information is not intended to replace advice given to you by your health care provider. Make sure you discuss any questions you have with your health care provider. Document Revised: 05/12/2022 Document Reviewed: 05/12/2022 Elsevier Patient Education  2024 [employer].

## 2024-06-13 ENCOUNTER — Encounter: Payer: Self-pay | Admitting: Nurse Practitioner

## 2024-06-13 DIAGNOSIS — E6609 Other obesity due to excess calories: Secondary | ICD-10-CM

## 2024-06-13 DIAGNOSIS — Z3041 Encounter for surveillance of contraceptive pills: Secondary | ICD-10-CM

## 2024-06-13 DIAGNOSIS — Z13220 Encounter for screening for lipoid disorders: Secondary | ICD-10-CM

## 2024-06-13 DIAGNOSIS — Z0000 Encounter for general adult medical examination without abnormal findings: Secondary | ICD-10-CM

## 2024-06-13 DIAGNOSIS — L2084 Intrinsic (allergic) eczema: Secondary | ICD-10-CM

## 2024-07-03 NOTE — Patient Instructions (Signed)
 Be Involved in Caring For Your Health:  Taking Medications When medications are taken as directed, they can greatly improve your health. But if they are not taken as prescribed, they may not work. In some cases, not taking them correctly can be harmful. To help ensure your treatment remains effective and safe, understand your medications and how to take them. Bring your medications to each visit for review by your provider.  Your lab results, notes, and after visit summary will be available on My Chart. We strongly encourage you to use this feature. If lab results are abnormal the clinic will contact you with the appropriate steps. If the clinic does not contact you assume the results are satisfactory. You can always view your results on My Chart. If you have questions regarding your health or results, please contact the clinic during office hours. You can also ask questions on My Chart.  We at [facility] are grateful that you chose us  to provide your care. We strive to provide evidence-based and compassionate care and are always looking for feedback. If you get a survey from the clinic please complete this so we can hear your opinions.  Healthy Eating, Adult Healthy eating may help you get and keep a healthy body weight, reduce the risk of chronic disease, and live a long and productive life. It is important to follow a healthy eating pattern. Your nutritional and calorie needs should be met mainly by different nutrient-rich foods. What are tips for following this plan? Reading food labels Read labels and choose the following: Reduced or low sodium products. Juices with 100% fruit juice. Foods with low saturated fats (<3 g per serving) and high polyunsaturated and monounsaturated fats. Foods with whole grains, such as whole wheat, cracked wheat, brown rice, and wild rice. Whole grains that are fortified with folic acid. This is recommended for females who are pregnant or who want to  become pregnant. Read labels and do not eat or drink the following: Foods or drinks with added sugars. These include foods that contain brown sugar, corn sweetener, corn syrup, dextrose , fructose, glucose, high-fructose corn syrup, honey, invert sugar, lactose, malt syrup, maltose, molasses, raw sugar, sucrose, trehalose, or turbinado sugar. Limit your intake of added sugars to less than 10% of your total daily calories. Do not eat more than the following amounts of added sugar per day: 6 teaspoons (25 g) for females. 9 teaspoons (38 g) for males. Foods that contain processed or refined starches and grains. Refined grain products, such as white flour, degermed cornmeal, white bread, and white rice. Shopping Choose nutrient-rich snacks, such as vegetables, whole fruits, and nuts. Avoid high-calorie and high-sugar snacks, such as potato chips, fruit snacks, and candy. Use oil-based dressings and spreads on foods instead of solid fats such as butter, margarine, sour cream, or cream cheese. Limit pre-made sauces, mixes, and instant products such as flavored rice, instant noodles, and ready-made pasta. Try more plant-protein sources, such as tofu, tempeh, black beans, edamame, lentils, nuts, and seeds. Explore eating plans such as the Mediterranean diet or vegetarian diet. Try heart-healthy dips made with beans and healthy fats like hummus and guacamole. Vegetables go great with these. Cooking Use oil to saut or stir-fry foods instead of solid fats such as butter, margarine, or lard. Try baking, boiling, grilling, or broiling instead of frying. Remove the fatty part of meats before cooking. Steam vegetables in water  or broth. Meal planning  At meals, imagine dividing your plate into fourths: One-half of  your plate is fruits and vegetables. One-fourth of your plate is whole grains. One-fourth of your plate is protein, especially lean meats, poultry, eggs, tofu, beans, or nuts. Include low-fat  dairy as part of your daily diet. Lifestyle Choose healthy options in all settings, including home, work, school, restaurants, or stores. Prepare your food safely: Wash your hands after handling raw meats. Where you prepare food, keep surfaces clean by regularly washing with hot, soapy water . Keep raw meats separate from ready-to-eat foods, such as fruits and vegetables. Cook seafood, meat, poultry, and eggs to the recommended temperature. Get a food thermometer. Store foods at safe temperatures. In general: Keep cold foods at 84F (4.4C) or below. Keep hot foods at 184F (60C) or above. Keep your freezer at [facility] (-17.8C) or below. Foods are not safe to eat if they have been between the temperatures of 40-184F (4.4-60C) for more than 2 hours. What foods should I eat? Fruits Aim to eat 1-2 cups of fresh, canned (in natural juice), or frozen fruits each day. One cup of fruit equals 1 small apple, 1 large banana, 8 large strawberries, 1 cup (237 g) canned fruit,  cup (82 g) dried fruit, or 1 cup (240 mL) 100% juice. Vegetables Aim to eat 2-4 cups of fresh and frozen vegetables each day, including different varieties and colors. One cup of vegetables equals 1 cup (91 g) broccoli or cauliflower florets, 2 medium carrots, 2 cups (150 g) raw, leafy greens, 1 large tomato, 1 large bell pepper, 1 large sweet potato, or 1 medium white potato. Grains Aim to eat 5-10 ounce-equivalents of whole grains each day. Examples of 1 ounce-equivalent of grains include 1 slice of bread, 1 cup (40 g) ready-to-eat cereal, 3 cups (24 g) popcorn, or  cup (93 g) cooked rice. Meats and other proteins Try to eat 5-7 ounce-equivalents of protein each day. Examples of 1 ounce-equivalent of protein include 1 egg,  oz nuts (12 almonds, 24 pistachios, or 7 walnut halves), 1/4 cup (90 g) cooked beans, 6 tablespoons (90 g) hummus or 1 tablespoon (16 g) peanut butter. A cut of meat or fish that is the size of a deck of  cards is about 3-4 ounce-equivalents (85 g). Of the protein you eat each week, try to have at least 8 sounce (227 g) of seafood. This is about 2 servings per week. This includes salmon, trout, herring, sardines, and anchovies. Dairy Aim to eat 3 cup-equivalents of fat-free or low-fat dairy each day. Examples of 1 cup-equivalent of dairy include 1 cup (240 mL) milk, 8 ounces (250 g) yogurt, 1 ounces (44 g) natural cheese, or 1 cup (240 mL) fortified soy milk. Fats and oils Aim for about 5 teaspoons (21 g) of fats and oils per day. Choose monounsaturated fats, such as canola and olive oils, mayonnaise made with olive oil or avocado oil, avocados, peanut butter, and most nuts, or polyunsaturated fats, such as sunflower, corn, and soybean oils, walnuts, pine nuts, sesame seeds, sunflower seeds, and flaxseed. Beverages Aim for 6 eight-ounce glasses of water  per day. Limit coffee to 3-5 eight-ounce cups per day. Limit caffeinated beverages that have added calories, such as soda and energy drinks. If you drink alcohol: Limit how much you have to: 0-1 drink a day if you are female. 0-2 drinks a day if you are female. Know how much alcohol is in your drink. In the [place] one drink is one 12 oz bottle of beer (355 mL), one 5 oz glass of wine (  148 mL), or one 1 oz glass of hard liquor (44 mL). Seasoning and other foods Try not to add too much salt to your food. Try using herbs and spices instead of salt. Try not to add sugar to food. This information is based on [place] nutrition guidelines. To learn more, visit [employer]. Exact amounts may vary. You may need different amounts. This information is not intended to replace advice given to you by your health care provider. Make sure you discuss any questions you have with your health care provider. Document Revised: 05/12/2022 Document Reviewed: 05/12/2022 Elsevier Patient Education  2024 [employer].

## 2024-07-06 ENCOUNTER — Ambulatory Visit: Admitting: Nurse Practitioner

## 2024-07-06 ENCOUNTER — Encounter: Payer: Self-pay | Admitting: Nurse Practitioner

## 2024-07-06 VITALS — BP 125/79 | HR 66 | Temp 98.2°F | Resp 17 | Ht 67.52 in | Wt 182.8 lb

## 2024-07-06 DIAGNOSIS — Z0000 Encounter for general adult medical examination without abnormal findings: Secondary | ICD-10-CM

## 2024-07-06 DIAGNOSIS — L2084 Intrinsic (allergic) eczema: Secondary | ICD-10-CM | POA: Diagnosis not present

## 2024-07-06 DIAGNOSIS — Z6828 Body mass index (BMI) 28.0-28.9, adult: Secondary | ICD-10-CM

## 2024-07-06 DIAGNOSIS — Z789 Other specified health status: Secondary | ICD-10-CM

## 2024-07-06 DIAGNOSIS — Z13220 Encounter for screening for lipoid disorders: Secondary | ICD-10-CM

## 2024-07-06 DIAGNOSIS — Z3041 Encounter for surveillance of contraceptive pills: Secondary | ICD-10-CM

## 2024-07-06 DIAGNOSIS — E66811 Other obesity due to excess calories: Secondary | ICD-10-CM

## 2024-07-06 LAB — PREGNANCY, URINE: Preg Test, Ur: NEGATIVE

## 2024-07-06 MED ORDER — NORETHINDRONE ACET-ETHINYL EST 1-20 MG-MCG PO TABS: 1.0000 | ORAL_TABLET | Freq: Every day | ORAL | 4 refills | Status: AC

## 2024-07-06 NOTE — Progress Notes (Signed)
 BP 125/79 (BP Location: Left Arm, Patient Position: Sitting, Cuff Size: Normal)   Pulse 66   Temp 98.2 F (36.8 C) (Oral)   Resp 17   Ht 5' 7.52 (1.715 m)   Wt 182 lb 12.8 oz (82.9 kg)   LMP  (LMP Unknown)   BMI 28.19 kg/m    Subjective:    Patient ID: Jessica Melendez, female    DOB: 01/01/1991, 33 y.o.   MRN: [id number]  HPI: Jessica Melendez is a 33 y.o. female presenting on 07/06/2024 for comprehensive medical examination. Current medical complaints include:none  She currently lives with: sister Menopausal Symptoms: no  Has been off Wegovy  since July. Continues to exercise regularly and work on diet changes.    Depression Screen done today and results listed below:     07/06/2024   10:38 AM 06/12/2023    3:10 PM 11/05/2022    1:58 PM 09/24/2022    9:16 AM 08/07/2022   10:51 AM  Depression screen PHQ 2/9  Decreased Interest 0 0 0 0 0  Down, Depressed, Hopeless 0 0 0 0 0  PHQ - 2 Score 0 0 0 0 0  Altered sleeping  0 0 0 0  Tired, decreased energy  1 0 0 0  Change in appetite  0 0 0 0  Feeling bad or failure about yourself   0 0 0 0  Trouble concentrating  1 0 0 0  Moving slowly or fidgety/restless  0 0 0 0  Suicidal thoughts  0 0 0 0  PHQ-9 Score  2  0  0  0   Difficult doing work/chores  Not difficult at all  Not difficult at all      Data saved with a previous flowsheet row definition      06/12/2023    3:10 PM 11/05/2022    1:58 PM 09/24/2022    9:16 AM 08/07/2022   10:51 AM  GAD 7 : Generalized Anxiety Score  Nervous, Anxious, on Edge 0 2 0 0  Control/stop worrying 0 0 0 0  Worry too much - different things 0 0 0 0  Trouble relaxing 0 0 0 0  Restless 0 2 0 0  Easily annoyed or irritable 0 2 0 0  Afraid - awful might happen 0 0 0 0  Total GAD 7 Score 0 6 0 0  Anxiety Difficulty Not difficult at all Not difficult at all Not difficult at all Not difficult at all      08/07/2022   10:51 AM 09/24/2022    9:16 AM 11/05/2022    1:58 PM 06/12/2023    3:09 PM  07/06/2024   10:35 AM  Fall Risk  Falls in the past year? 0 0 0 0 0  Was there an injury with Fall? 0 0 0 0 0  Fall Risk Category Calculator 0 0 0 0 0  Fall Risk Category (Retired) Low       Patient at Risk for Falls Due to No Fall Risks Other (Comment);No Fall Risks No Fall Risks No Fall Risks No Fall Risks  Fall risk Follow up Falls evaluation completed  Falls evaluation completed Falls evaluation completed Falls evaluation completed Falls evaluation completed     Data saved with a previous flowsheet row definition     Past Medical History:  Past Medical History:  Diagnosis Date   Eczema     Surgical History:  Past Surgical History:  Procedure Laterality Date   MASS EXCISION Right 10/07/2018  Procedure: EXCISION AXILLARY MASS;  Surgeon: Jordis Laneta FALCON, MD;  Location: [facility] ORS;  Service: General;  Laterality: Right;    Medications:  Current Outpatient Medications on File Prior to Visit  Medication Sig   Multiple Vitamins-Minerals (MULTIVITAMIN PO) Take 1 tablet by mouth daily.    No current facility-administered medications on file prior to visit.    Allergies:  No Known Allergies  Social History:  Social History   Socioeconomic History   Marital status: Single    Spouse name: Not on file   Number of children: Not on file   Years of education: Not on file   Highest education level: Not on file  Occupational History   Occupation: social worker    Comment: Community Education Officer  Tobacco Use   Smoking status: Never   Smokeless tobacco: Never  Vaping Use   Vaping status: Never Used  Substance and Sexual Activity   Alcohol use: Yes    Comment: very rare use   Drug use: No   Sexual activity: Yes    Partners: Male    Birth control/protection: Pill  Other Topics Concern   Not on file  Social History Narrative   Highest level of education in Biology from [place] .   She aspires to be a engineer, civil (consulting) and then possibly to be a NP.   Works as a production assistant, radio at [employer]   Enjoys  playing soccer.    Social Drivers of Corporate Investment Banker Strain: Low Risk  (07/06/2024)   Overall Financial Resource Strain (CARDIA)    Difficulty of Paying Living Expenses: Not hard at all  Food Insecurity: No Food Insecurity (07/06/2024)   Hunger Vital Sign    Worried About Running Out of Food in the Last Year: Never true    Ran Out of Food in the Last Year: Never true  Transportation Needs: No Transportation Needs (04/10/2022)   PRAPARE - Administrator, Civil Service (Medical): No    Lack of Transportation (Non-Medical): No  Physical Activity: Insufficiently Active (07/06/2024)   Exercise Vital Sign    Days of Exercise per Week: 2 days    Minutes of Exercise per Session: 40 min  Stress: No Stress Concern Present (07/06/2024)   [employer] of Occupational Health - Occupational Stress Questionnaire    Feeling of Stress: Not at all  Social Connections: Moderately Isolated (07/06/2024)   Social Connection and Isolation Panel    Frequency of Communication with Friends and Family: Three times a week    Frequency of Social Gatherings with Friends and Family: Three times a week    Attends Religious Services: Never    Active Member of Clubs or Organizations: No    Attends Banker Meetings: Never    Marital Status: Living with partner  Intimate Partner Violence: Not At Risk (07/06/2024)   Humiliation, Afraid, Rape, and Kick questionnaire    Fear of Current or Ex-Partner: No    Emotionally Abused: No    Physically Abused: No    Sexually Abused: No   Social History   Tobacco Use  Smoking Status Never  Smokeless Tobacco Never   Social History   Substance and Sexual Activity  Alcohol Use Yes   Comment: very rare use    Family History:  Family History  Problem Relation Age of Onset   Hypertension Father    Sleep apnea Father    Hypertension Paternal Grandmother    Hypertension Paternal Grandfather    Diabetes Paternal Actor  Past medical history, surgical history, medications, allergies, family history and social history reviewed with patient today and changes made to appropriate areas of the chart.   ROS All other ROS negative except what is listed above and in the HPI.      Objective:    BP 125/79 (BP Location: Left Arm, Patient Position: Sitting, Cuff Size: Normal)   Pulse 66   Temp 98.2 F (36.8 C) (Oral)   Resp 17   Ht 5' 7.52 (1.715 m)   Wt 182 lb 12.8 oz (82.9 kg)   LMP  (LMP Unknown)   BMI 28.19 kg/m   Wt Readings from Last 3 Encounters:  07/06/24 182 lb 12.8 oz (82.9 kg)  05/31/24 178 lb (80.7 kg)  06/12/23 198 lb 3.2 oz (89.9 kg)    Physical Exam Vitals and nursing note reviewed. Exam conducted with a chaperone present.  Constitutional:      General: She is awake. She is not in acute distress.    Appearance: She is well-developed and well-groomed. She is not ill-appearing or toxic-appearing.  HENT:     Head: Normocephalic and atraumatic.     Right Ear: Hearing, tympanic membrane, ear canal and external ear normal. No drainage.     Left Ear: Hearing, tympanic membrane, ear canal and external ear normal. No drainage.     Nose: Nose normal.     Right Sinus: No maxillary sinus tenderness or frontal sinus tenderness.     Left Sinus: No maxillary sinus tenderness or frontal sinus tenderness.     Mouth/Throat:     Mouth: Mucous membranes are moist.     Pharynx: Oropharynx is clear. Uvula midline. No pharyngeal swelling, oropharyngeal exudate or posterior oropharyngeal erythema.  Eyes:     General: Lids are normal.        Right eye: No discharge.        Left eye: No discharge.     Extraocular Movements: Extraocular movements intact.     Conjunctiva/sclera: Conjunctivae normal.     Pupils: Pupils are equal, round, and reactive to light.     Visual Fields: Right eye visual fields normal and left eye visual fields normal.  Neck:     Thyroid: No thyromegaly.     Vascular: No carotid  bruit.     Trachea: Trachea normal.  Cardiovascular:     Rate and Rhythm: Normal rate and regular rhythm.     Heart sounds: Normal heart sounds. No murmur heard.    No gallop.  Pulmonary:     Effort: Pulmonary effort is normal. No accessory muscle usage or respiratory distress.     Breath sounds: Normal breath sounds.  Chest:  Breasts:    Right: Normal.     Left: Normal.  Abdominal:     General: Bowel sounds are normal.     Palpations: Abdomen is soft. There is no hepatomegaly or splenomegaly.     Tenderness: There is no abdominal tenderness.  Musculoskeletal:        General: Normal range of motion.     Cervical back: Normal range of motion and neck supple.     Right lower leg: No edema.     Left lower leg: No edema.  Lymphadenopathy:     Head:     Right side of head: No submental, submandibular, tonsillar, preauricular or posterior auricular adenopathy.     Left side of head: No submental, submandibular, tonsillar, preauricular or posterior auricular adenopathy.     Cervical: No cervical adenopathy.  Upper Body:     Right upper body: No supraclavicular, axillary or pectoral adenopathy.     Left upper body: No supraclavicular, axillary or pectoral adenopathy.  Skin:    General: Skin is warm and dry.     Capillary Refill: Capillary refill takes less than 2 seconds.     Findings: No rash.  Neurological:     Mental Status: She is alert and oriented to person, place, and time.     Gait: Gait is intact.     Deep Tendon Reflexes: Reflexes are normal and symmetric.     Reflex Scores:      Brachioradialis reflexes are 2+ on the right side and 2+ on the left side.      Patellar reflexes are 2+ on the right side and 2+ on the left side. Psychiatric:        Attention and Perception: Attention normal.        Mood and Affect: Mood normal.        Speech: Speech normal.        Behavior: Behavior normal. Behavior is cooperative.        Thought Content: Thought content normal.         Judgment: Judgment normal.    Results for orders placed or performed in visit on 07/15/23  TSH   Collection Time: 07/15/23  8:28 AM  Result Value Ref Range   TSH 0.653 0.450 - 4.500 uIU/mL  CBC with Differential/Platelet   Collection Time: 07/15/23  8:28 AM  Result Value Ref Range   WBC 7.9 3.4 - 10.8 x10E3/uL   RBC 4.38 3.77 - 5.28 x10E6/uL   Hemoglobin 14.3 11.1 - 15.9 g/dL   Hematocrit 56.1 65.9 - 46.6 %   MCV 100 (H) 79 - 97 fL   MCH 32.6 26.6 - 33.0 pg   MCHC 32.6 31.5 - 35.7 g/dL   RDW 88.7 (L) 88.2 - 84.5 %   Platelets 357 150 - 450 x10E3/uL   Neutrophils 56 Not Estab. %   Lymphs 35 Not Estab. %   Monocytes 8 Not Estab. %   Eos 1 Not Estab. %   Basos 0 Not Estab. %   Neutrophils Absolute 4.3 1.4 - 7.0 x10E3/uL   Lymphocytes Absolute 2.8 0.7 - 3.1 x10E3/uL   Monocytes Absolute 0.6 0.1 - 0.9 x10E3/uL   EOS (ABSOLUTE) 0.1 0.0 - 0.4 x10E3/uL   Basophils Absolute 0.0 0.0 - 0.2 x10E3/uL   Immature Granulocytes 0 Not Estab. %   Immature Grans (Abs) 0.0 0.0 - 0.1 x10E3/uL      Assessment & Plan:   Problem List Items Addressed This Visit       Musculoskeletal and Integument   Intrinsic eczema - Primary   Ongoing issue since childhood, has tried topical steroids without benefit.  Continue collaboration with dermatology.  Labs today.      Relevant Orders   CBC with Differential/Platelet   TSH     Other   Contraception management   Continue current regimen, refills sent in.  Urine pregnancy testing today and CMP.      Relevant Orders   Pregnancy, urine   BMI 28.0-28.9,adult   BMI 28.19, has been off of Wegovy  since July 2025 and overall maintaining weight loss.  Recommended eating smaller high protein, low fat meals more frequently and exercising 30 mins a day 5 times a week with a goal of 10-15lb weight loss in the next 3 months. Patient voiced their understanding and motivation to adhere  to these recommendations.        Other Visit Diagnoses       Encounter  for lipid screening for cardiovascular disease       Lipid panel today.   Relevant Orders   Comprehensive metabolic panel with GFR   Lipid Panel w/o Chol/HDL Ratio     Encounter for annual physical exam       Annual physical today with labs and health maintenance reviewed, discussed with patient.        Follow up plan: Return in about 1 year (around 07/06/2025) for Annual Physical.   LABORATORY TESTING:  - Pap smear: up to date  IMMUNIZATIONS:   - Tdap: Tetanus vaccination status reviewed: last tetanus booster within 10 years. - Influenza: Refused - Pneumovax: Not applicable - Prevnar: Not applicable - COVID: Up to date had first two - HPV: Refused - Shingrix vaccine: Not applicable  SCREENING: -Mammogram: Not applicable  - Colonoscopy: Not applicable  - Bone Density: Not applicable  -Hearing Test: Not applicable  -Spirometry: Not applicable   PATIENT COUNSELING:   Advised to take 1 mg of folate supplement per day if capable of pregnancy.   Sexuality: Discussed sexually transmitted diseases, partner selection, use of condoms, avoidance of unintended pregnancy  and contraceptive alternatives.   Advised to avoid cigarette smoking.  I discussed with the patient that most people either abstain from alcohol or drink within safe limits (<=14/week and <=4 drinks/occasion for males, <=7/weeks and <= 3 drinks/occasion for females) and that the risk for alcohol disorders and other health effects rises proportionally with the number of drinks per week and how often a drinker exceeds daily limits.  Discussed cessation/primary prevention of drug use and availability of treatment for abuse.   Diet: Encouraged to adjust caloric intake to maintain  or achieve ideal body weight, to reduce intake of dietary saturated fat and total fat, to limit sodium intake by avoiding high sodium foods and not adding table salt, and to maintain adequate dietary potassium and calcium preferably from fresh  fruits, vegetables, and low-fat dairy products.    Stressed the importance of regular exercise  Injury prevention: Discussed safety belts, safety helmets, smoke detector, smoking near bedding or upholstery.   Dental health: Discussed importance of regular tooth brushing, flossing, and dental visits.    NEXT PREVENTATIVE PHYSICAL DUE IN 1 YEAR. Return in about 1 year (around 07/06/2025) for Annual Physical.

## 2024-07-06 NOTE — Assessment & Plan Note (Signed)
 BMI 28.19, has been off of Wegovy  since July 2025 and overall maintaining weight loss.  Recommended eating smaller high protein, low fat meals more frequently and exercising 30 mins a day 5 times a week with a goal of 10-15lb weight loss in the next 3 months. Patient voiced their understanding and motivation to adhere to these recommendations.

## 2024-07-06 NOTE — Assessment & Plan Note (Signed)
Ongoing issue since childhood, has tried topical steroids without benefit.  Continue collaboration with dermatology.  Labs today.

## 2024-07-06 NOTE — Assessment & Plan Note (Signed)
 Continue current regimen, refills sent in.  Urine pregnancy testing today and CMP.

## 2024-07-07 ENCOUNTER — Ambulatory Visit: Payer: Self-pay | Admitting: Nurse Practitioner

## 2024-07-07 LAB — CBC WITH DIFFERENTIAL/PLATELET
Basophils Absolute: 0 x10E3/uL (ref 0.0–0.2)
Basos: 1 %
EOS (ABSOLUTE): 0.1 x10E3/uL (ref 0.0–0.4)
Eos: 2 %
Hematocrit: 43.2 % (ref 34.0–46.6)
Hemoglobin: 14.6 g/dL (ref 11.1–15.9)
Immature Grans (Abs): 0 x10E3/uL (ref 0.0–0.1)
Immature Granulocytes: 0 %
Lymphocytes Absolute: 3.2 x10E3/uL — ABNORMAL HIGH (ref 0.7–3.1)
Lymphs: 45 %
MCH: 32.8 pg (ref 26.6–33.0)
MCHC: 33.8 g/dL (ref 31.5–35.7)
MCV: 97 fL (ref 79–97)
Monocytes Absolute: 0.7 x10E3/uL (ref 0.1–0.9)
Monocytes: 10 %
Neutrophils Absolute: 2.9 x10E3/uL (ref 1.4–7.0)
Neutrophils: 42 %
Platelets: 354 x10E3/uL (ref 150–450)
RBC: 4.45 x10E6/uL (ref 3.77–5.28)
RDW: 11.3 % — ABNORMAL LOW (ref 11.7–15.4)
WBC: 7 x10E3/uL (ref 3.4–10.8)

## 2024-07-07 LAB — COMPREHENSIVE METABOLIC PANEL WITH GFR
ALT: 28 IU/L (ref 0–32)
AST: 16 IU/L (ref 0–40)
Albumin: 4.3 g/dL (ref 3.9–4.9)
Alkaline Phosphatase: 56 IU/L (ref 41–116)
BUN/Creatinine Ratio: 12 (ref 9–23)
BUN: 11 mg/dL (ref 6–20)
Bilirubin Total: 0.4 mg/dL (ref 0.0–1.2)
CO2: 21 mmol/L (ref 20–29)
Calcium: 9.7 mg/dL (ref 8.7–10.2)
Chloride: 105 mmol/L (ref 96–106)
Creatinine, Ser: 0.93 mg/dL (ref 0.57–1.00)
Globulin, Total: 2.9 g/dL (ref 1.5–4.5)
Glucose: 86 mg/dL (ref 70–99)
Potassium: 4.3 mmol/L (ref 3.5–5.2)
Sodium: 141 mmol/L (ref 134–144)
Total Protein: 7.2 g/dL (ref 6.0–8.5)
eGFR: 84 mL/min/1.73 (ref >59)

## 2024-07-07 LAB — TSH: TSH: 0.521 u[IU]/mL (ref 0.450–4.500)

## 2024-07-07 LAB — LIPID PANEL W/O CHOL/HDL RATIO
Cholesterol, Total: 170 mg/dL (ref 100–199)
HDL: 57 mg/dL (ref >39)
LDL Chol Calc (NIH): 100 mg/dL — ABNORMAL HIGH (ref 0–99)
Triglycerides: 66 mg/dL (ref 0–149)
VLDL Cholesterol Cal: 13 mg/dL (ref 5–40)

## 2024-07-07 NOTE — Progress Notes (Signed)
 Contacted via MyChart  Good morning Jessica Melendez, your labs have returned and are overall reassuring with exception of LDL, bad cholesterol.  This is a little elevated this check. Over time and in combination with inflammation and other factors, this contributes to plaque which in turn may lead to stroke and/or heart attack down the road.  Sometimes high LDL is primarily genetic, and people might be eating all the right foods but still have high numbers.  Other times, there is room for improvement in one's diet and eating healthier can bring this number down and potentially reduce one's risk of heart attack and/or stroke. To reduce your LDL, Remember - more fruits and vegetables, more fish, and limit red meat and dairy products.  More soy, nuts, beans, barley, lentils, oats and plant sterol ester enriched margarine instead of butter.  I also encourage eliminating sugar and processed food.  Remember, shop on the outside of the grocery store and visit your [employer]. Any questions? Keep being amazing!!  Thank you for allowing me to participate in your care.  I appreciate you. Kindest regards, Yannis Gumbs

## 2024-08-10 ENCOUNTER — Other Ambulatory Visit: Payer: Self-pay | Admitting: Nurse Practitioner

## 2024-08-12 NOTE — Telephone Encounter (Signed)
 Too soon for refill.  Requested Prescriptions  Pending Prescriptions Disp Refills   norethindrone -ethinyl estradiol  (LOESTRIN) 1-20 MG-MCG tablet [Pharmacy Med Name: NORETHINDRONE  ACET/ETH 1/20 TB 21'S] 84 tablet 4    Sig: TAKE 1 TABLET BY MOUTH DAILY     OB/GYN:  Contraceptives Passed - 08/12/2024  2:01 PM      Passed - Last BP in normal range    BP Readings from Last 1 Encounters:  07/06/24 125/79         Passed - Valid encounter within last 12 months    Recent Outpatient Visits           1 month ago Intrinsic eczema   [facility] [facility] [place], Melanie T, NP   2 months ago Abnormal vaginal bleeding   [place] [facility] Melvin Pao, NP              Passed - Patient is not a smoker

## 2024-08-29 ENCOUNTER — Encounter: Payer: Self-pay | Admitting: Nurse Practitioner

## 2025-07-12 ENCOUNTER — Encounter: Admitting: Nurse Practitioner
